# Patient Record
Sex: Female | Born: 1990 | Hispanic: No | Marital: Married | Smoking: Never smoker
Health system: Southern US, Community
[De-identification: ages and names within clinical notes are randomized; demographics above are authoritative.]

## PROBLEM LIST (undated history)

## (undated) DIAGNOSIS — Z789 Other specified health status: Secondary | ICD-10-CM

## (undated) HISTORY — PX: NO PAST SURGERIES: SHX2092

---

## 2015-04-18 LAB — OB RESULTS CONSOLE HIV ANTIBODY (ROUTINE TESTING): HIV: NONREACTIVE

## 2015-04-18 LAB — OB RESULTS CONSOLE RUBELLA ANTIBODY, IGM: Rubella: IMMUNE

## 2015-04-18 LAB — OB RESULTS CONSOLE ANTIBODY SCREEN: ANTIBODY SCREEN: NEGATIVE

## 2015-04-18 LAB — OB RESULTS CONSOLE GC/CHLAMYDIA
CHLAMYDIA, DNA PROBE: NEGATIVE
Gonorrhea: NEGATIVE

## 2015-04-18 LAB — OB RESULTS CONSOLE ABO/RH: RH TYPE: POSITIVE

## 2015-04-18 LAB — OB RESULTS CONSOLE RPR: RPR: NONREACTIVE

## 2015-04-18 LAB — OB RESULTS CONSOLE HEPATITIS B SURFACE ANTIGEN: Hepatitis B Surface Ag: NEGATIVE

## 2015-05-19 NOTE — L&D Delivery Note (Signed)
Operative Delivery Note At 12:03 PM a viable female was delivered via Vaginal, [employer], operational(Extractor).  Presentation: vertex; Position: Left,, Occiput,, Anterior; Station: +3.  Verbal consent: unable to obtain verbal consent due to language barrier.  Risks and benefits discussed in detail.  Risks include, but are not limited to the risks of anesthesia, bleeding, infection, damage to maternal tissues, fetal cephalhematoma.  There is also the risk of inability to effect vaginal delivery of the head, or shoulder dystocia that cannot be resolved by established maneuvers, leading to the need for emergency cesarean section.  APGAR: 9, 9; weight  .   Placenta status: Intact, Spontaneous.   Cord: 3 vessels with the following complications: None.  Cord pH: n/a  Anesthesia: Epidural  Instruments: kiwi Episiotomy: None Lacerations: 2nd degree Suture Repair: 2.0 vicryl Est. Blood Loss (mL): 100  Mom to postpartum.  Baby to Couplet care / Skin to Skin.  Wyvonnia DuskyLAWSON, MARIE DARLENE 06/02/2015, 12:14 PM

## 2015-05-21 LAB — OB RESULTS CONSOLE GBS: STREP GROUP B AG: NEGATIVE

## 2015-05-31 ENCOUNTER — Inpatient Hospital Stay (HOSPITAL_COMMUNITY)
Admission: AD | Admit: 2015-05-31 | Discharge: 2015-05-31 | Disposition: A | Discharge status: Home / Self Care | Payer: Self-pay | Source: Ambulatory Visit | Attending: Family Medicine | Admitting: Family Medicine

## 2015-05-31 ENCOUNTER — Encounter (HOSPITAL_COMMUNITY): Payer: Self-pay | Admitting: *Deleted

## 2015-05-31 DIAGNOSIS — Z3493 Encounter for supervision of normal pregnancy, unspecified, third trimester: Secondary | ICD-10-CM | POA: Insufficient documentation

## 2015-05-31 MED ORDER — MORPHINE SULFATE (PF) 4 MG/ML IV SOLN
4.0000 mg | Freq: Once | INTRAVENOUS | Status: AC
  Administered 2015-05-31: 4 mg via INTRAMUSCULAR
  Filled 2015-05-31: qty 1

## 2015-05-31 MED ORDER — MORPHINE SULFATE (PF) 4 MG/ML IV SOLN
4.0000 mg | Freq: Once | INTRAVENOUS | Status: AC
  Administered 2015-05-31: 4 mg via SUBCUTANEOUS
  Filled 2015-05-31: qty 1

## 2015-05-31 NOTE — MAU Note (Signed)
PT SAYS  SHE WAS  AT HD  YESTERDAY   .   NO VE.     LAST  WEEK-  VE    CLOSED.       HURT BAD  SINCE  0200

## 2015-05-31 NOTE — Discharge Instructions (Signed)
Abdominal Pain During Pregnancy Belly (abdominal) pain is common during pregnancy. Most of the time, it is not a serious problem. Other times, it can be a sign that something is wrong with the pregnancy. Always tell your doctor if you have belly pain. HOME CARE Monitor your belly pain for any changes. The following actions may help you feel better:  Do not have sex (intercourse) or put anything in your vagina until you feel better.  Rest until your pain stops.  Drink clear fluids if you feel sick to your stomach (nauseous). Do not eat solid food until you feel better.  Only take medicine as told by your doctor.  Keep all doctor visits as told. GET HELP RIGHT AWAY IF:   You are bleeding, leaking fluid, or pieces of tissue come out of your vagina.  You have more pain or cramping.  You keep throwing up (vomiting).  You have pain when you pee (urinate) or have blood in your pee.  You have a fever.  You do not feel your baby moving as much.  You feel very weak or feel like passing out.  You have trouble breathing, with or without belly pain.  You have a very bad headache and belly pain.  You have fluid leaking from your vagina and belly pain.  You keep having watery poop (diarrhea).  Your belly pain does not go away after resting, or the pain gets worse. MAKE SURE YOU:   Understand these instructions.  Will watch your condition.  Will get help right away if you are not doing well or get worse.   This information is not intended to replace advice given to you by your health care provider. Make sure you discuss any questions you have with your health care provider.   Document Released: 04/22/2009 Document Revised: 01/04/2013 Document Reviewed: 12/01/2012 Elsevier Interactive Patient Education 2016 [employer] Fetal Movement Counts Patient Name: __________________________________________________ Patient Due Date: ____________________ Performing a fetal movement count  is highly recommended in high-risk pregnancies, but it is good for every pregnant woman to do. Your health care provider may ask you to start counting fetal movements at 28 weeks of the pregnancy. Fetal movements often increase:  After eating a full meal.  After physical activity.  After eating or drinking something sweet or cold.  At rest. Pay attention to when you feel the baby is most active. This will help you notice a pattern of your baby's sleep and wake cycles and what factors contribute to an increase in fetal movement. It is important to perform a fetal movement count at the same time each day when your baby is normally most active.  HOW TO COUNT FETAL MOVEMENTS  Find a quiet and comfortable area to sit or lie down on your left side. Lying on your left side provides the best blood and oxygen circulation to your baby.  Write down the day and time on a sheet of paper or in a journal.  Start counting kicks, flutters, swishes, rolls, or jabs in a 2-hour period. You should feel at least 10 movements within 2 hours.  If you do not feel 10 movements in 2 hours, wait 2-3 hours and count again. Look for a change in the pattern or not enough counts in 2 hours. SEEK MEDICAL CARE IF:  You feel less than 10 counts in 2 hours, tried twice.  There is no movement in over an hour.  The pattern is changing or taking longer each day to reach  10 counts in 2 hours.  You feel the baby is not moving as he or she usually does. Date: ____________ Movements: ____________ Start time: ____________ Doreatha Martin time: ____________  Date: ____________ Movements: ____________ Start time: ____________ Doreatha Martin time: ____________ Date: ____________ Movements: ____________ Start time: ____________ Doreatha Martin time: ____________ Date: ____________ Movements: ____________ Start time: ____________ Doreatha Martin time: ____________ Date: ____________ Movements: ____________ Start time: ____________ Doreatha Martin time: ____________ Date:  ____________ Movements: ____________ Start time: ____________ Doreatha Martin time: ____________ Date: ____________ Movements: ____________ Start time: ____________ Doreatha Martin time: ____________ Date: ____________ Movements: ____________ Start time: ____________ Doreatha Martin time: ____________  Date: ____________ Movements: ____________ Start time: ____________ Doreatha Martin time: ____________ Date: ____________ Movements: ____________ Start time: ____________ Doreatha Martin time: ____________ Date: ____________ Movements: ____________ Start time: ____________ Doreatha Martin time: ____________ Date: ____________ Movements: ____________ Start time: ____________ Doreatha Martin time: ____________ Date: ____________ Movements: ____________ Start time: ____________ Doreatha Martin time: ____________ Date: ____________ Movements: ____________ Start time: ____________ Doreatha Martin time: ____________ Date: ____________ Movements: ____________ Start time: ____________ Doreatha Martin time: ____________  Date: ____________ Movements: ____________ Start time: ____________ Doreatha Martin time: ____________ Date: ____________ Movements: ____________ Start time: ____________ Doreatha Martin time: ____________ Date: ____________ Movements: ____________ Start time: ____________ Doreatha Martin time: ____________ Date: ____________ Movements: ____________ Start time: ____________ Doreatha Martin time: ____________ Date: ____________ Movements: ____________ Start time: ____________ Doreatha Martin time: ____________ Date: ____________ Movements: ____________ Start time: ____________ Doreatha Martin time: ____________ Date: ____________ Movements: ____________ Start time: ____________ Doreatha Martin time: ____________  Date: ____________ Movements: ____________ Start time: ____________ Doreatha Martin time: ____________ Date: ____________ Movements: ____________ Start time: ____________ Doreatha Martin time: ____________ Date: ____________ Movements: ____________ Start time: ____________ Doreatha Martin time: ____________ Date: ____________ Movements: ____________ Start  time: ____________ Doreatha Martin time: ____________ Date: ____________ Movements: ____________ Start time: ____________ Doreatha Martin time: ____________ Date: ____________ Movements: ____________ Start time: ____________ Doreatha Martin time: ____________ Date: ____________ Movements: ____________ Start time: ____________ Doreatha Martin time: ____________  Date: ____________ Movements: ____________ Start time: ____________ Doreatha Martin time: ____________ Date: ____________ Movements: ____________ Start time: ____________ Doreatha Martin time: ____________ Date: ____________ Movements: ____________ Start time: ____________ Doreatha Martin time: ____________ Date: ____________ Movements: ____________ Start time: ____________ Doreatha Martin time: ____________ Date: ____________ Movements: ____________ Start time: ____________ Doreatha Martin time: ____________ Date: ____________ Movements: ____________ Start time: ____________ Doreatha Martin time: ____________ Date: ____________ Movements: ____________ Start time: ____________ Doreatha Martin time: ____________  Date: ____________ Movements: ____________ Start time: ____________ Doreatha Martin time: ____________ Date: ____________ Movements: ____________ Start time: ____________ Doreatha Martin time: ____________ Date: ____________ Movements: ____________ Start time: ____________ Doreatha Martin time: ____________ Date: ____________ Movements: ____________ Start time: ____________ Doreatha Martin time: ____________ Date: ____________ Movements: ____________ Start time: ____________ Doreatha Martin time: ____________ Date: ____________ Movements: ____________ Start time: ____________ Doreatha Martin time: ____________ Date: ____________ Movements: ____________ Start time: ____________ Doreatha Martin time: ____________  Date: ____________ Movements: ____________ Start time: ____________ Doreatha Martin time: ____________ Date: ____________ Movements: ____________ Start time: ____________ Doreatha Martin time: ____________ Date: ____________ Movements: ____________ Start time: ____________ Doreatha Martin time:  ____________ Date: ____________ Movements: ____________ Start time: ____________ Doreatha Martin time: ____________ Date: ____________ Movements: ____________ Start time: ____________ Doreatha Martin time: ____________ Date: ____________ Movements: ____________ Start time: ____________ Doreatha Martin time: ____________ Date: ____________ Movements: ____________ Start time: ____________ Doreatha Martin time: ____________  Date: ____________ Movements: ____________ Start time: ____________ Doreatha Martin time: ____________ Date: ____________ Movements: ____________ Start time: ____________ Doreatha Martin time: ____________ Date: ____________ Movements: ____________ Start time: ____________ Doreatha Martin time: ____________ Date: ____________ Movements: ____________ Start time: ____________ Doreatha Martin time: ____________ Date: ____________ Movements: ____________ Start time: ____________ Doreatha Martin time: ____________ Date: ____________ Movements: ____________ Start time: ____________ Doreatha Martin time: ____________   This information is not intended to replace advice  given to you by your health care provider. Make sure you discuss any questions you have with your health care provider.   Document Released: 06/03/2006 Document Revised: 05/25/2014 Document Reviewed: 02/29/2012 Elsevier Interactive Patient Education [employer] Third Trimester of Pregnancy The third trimester is from week 29 through week 42, months 7 through 9. The third trimester is a time when the fetus is growing rapidly. At the end of the ninth month, the fetus is about 20 inches in length and weighs 6-10 pounds.  BODY CHANGES Your body goes through many changes during pregnancy. The changes vary from woman to woman.   Your weight will continue to increase. You can expect to gain 25-35 pounds (11-16 kg) by the end of the pregnancy.  You may begin to get stretch marks on your hips, abdomen, and breasts.  You may urinate more often because the fetus is moving lower into your pelvis and pressing  on your bladder.  You may develop or continue to have heartburn as a result of your pregnancy.  You may develop constipation because certain hormones are causing the muscles that push waste through your intestines to slow down.  You may develop hemorrhoids or swollen, bulging veins (varicose veins).  You may have pelvic pain because of the weight gain and pregnancy hormones relaxing your joints between the bones in your pelvis. Backaches may result from overexertion of the muscles supporting your posture.  You may have changes in your hair. These can include thickening of your hair, rapid growth, and changes in texture. Some women also have hair loss during or after pregnancy, or hair that feels dry or thin. Your hair will most likely return to normal after your baby is born.  Your breasts will continue to grow and be tender. A yellow discharge may leak from your breasts called colostrum.  Your belly button may stick out.  You may feel short of breath because of your expanding uterus.  You may notice the fetus "dropping," or moving lower in your abdomen.  You may have a bloody mucus discharge. This usually occurs a few days to a week before labor begins.  Your cervix becomes thin and soft (effaced) near your due date. WHAT TO EXPECT AT YOUR PRENATAL EXAMS  You will have prenatal exams every 2 weeks until week 36. Then, you will have weekly prenatal exams. During a routine prenatal visit:  You will be weighed to make sure you and the fetus are growing normally.  Your blood pressure is taken.  Your abdomen will be measured to track your baby's growth.  The fetal heartbeat will be listened to.  Any test results from the previous visit will be discussed.  You may have a cervical check near your due date to see if you have effaced. At around 36 weeks, your caregiver will check your cervix. At the same time, your caregiver will also perform a test on the secretions of the vaginal  tissue. This test is to determine if a type of bacteria, Group B streptococcus, is present. Your caregiver will explain this further. Your caregiver may ask you:  What your birth plan is.  How you are feeling.  If you are feeling the baby move.  If you have had any abnormal symptoms, such as leaking fluid, bleeding, severe headaches, or abdominal cramping.  If you are using any tobacco products, including cigarettes, chewing tobacco, and electronic cigarettes.  If you have any questions. Other tests or screenings that may be performed  during your third trimester include:  Blood tests that check for low iron levels (anemia).  Fetal testing to check the health, activity level, and growth of the fetus. Testing is done if you have certain medical conditions or if there are problems during the pregnancy.  HIV (human immunodeficiency virus) testing. If you are at high risk, you may be screened for HIV during your third trimester of pregnancy. FALSE LABOR You may feel small, irregular contractions that eventually go away. These are called Braxton Hicks contractions, or false labor. Contractions may last for hours, days, or even weeks before true labor sets in. If contractions come at regular intervals, intensify, or become painful, it is best to be seen by your caregiver.  SIGNS OF LABOR   Menstrual-like cramps.  Contractions that are 5 minutes apart or less.  Contractions that start on the top of the uterus and spread down to the lower abdomen and back.  A sense of increased pelvic pressure or back pain.  A watery or bloody mucus discharge that comes from the vagina. If you have any of these signs before the 37th week of pregnancy, call your caregiver right away. You need to go to the hospital to get checked immediately. HOME CARE INSTRUCTIONS   Avoid all smoking, herbs, alcohol, and unprescribed drugs. These chemicals affect the formation and growth of the baby.  Do not use any  tobacco products, including cigarettes, chewing tobacco, and electronic cigarettes. If you need help quitting, ask your health care provider. You may receive counseling support and other resources to help you quit.  Follow your caregiver's instructions regarding medicine use. There are medicines that are either safe or unsafe to take during pregnancy.  Exercise only as directed by your caregiver. Experiencing uterine cramps is a good sign to stop exercising.  Continue to eat regular, healthy meals.  Wear a good support bra for breast tenderness.  Do not use hot tubs, steam rooms, or saunas.  Wear your seat belt at all times when driving.  Avoid raw meat, uncooked cheese, cat litter boxes, and soil used by cats. These carry germs that can cause birth defects in the baby.  Take your prenatal vitamins.  Take 1500-2000 mg of calcium daily starting at the 20th week of pregnancy until you deliver your baby.  Try taking a stool softener (if your caregiver approves) if you develop constipation. Eat more high-fiber foods, such as fresh vegetables or fruit and whole grains. Drink plenty of fluids to keep your urine clear or pale yellow.  Take warm sitz baths to soothe any pain or discomfort caused by hemorrhoids. Use hemorrhoid cream if your caregiver approves.  If you develop varicose veins, wear support hose. Elevate your feet for 15 minutes, 3-4 times a day. Limit salt in your diet.  Avoid heavy lifting, wear low heal shoes, and practice good posture.  Rest a lot with your legs elevated if you have leg cramps or low back pain.  Visit your dentist if you have not gone during your pregnancy. Use a soft toothbrush to brush your teeth and be gentle when you floss.  A sexual relationship may be continued unless your caregiver directs you otherwise.  Do not travel far distances unless it is absolutely necessary and only with the approval of your caregiver.  Take prenatal classes to understand,  practice, and ask questions about the labor and delivery.  Make a trial run to the hospital.  Pack your hospital bag.  Prepare the baby's nursery.  Continue to go to all your prenatal visits as directed by your caregiver. SEEK MEDICAL CARE IF:  You are unsure if you are in labor or if your water has broken.  You have dizziness.  You have mild pelvic cramps, pelvic pressure, or nagging pain in your abdominal area.  You have persistent nausea, vomiting, or diarrhea.  You have a bad smelling vaginal discharge.  You have pain with urination. SEEK IMMEDIATE MEDICAL CARE IF:   You have a fever.  You are leaking fluid from your vagina.  You have spotting or bleeding from your vagina.  You have severe abdominal cramping or pain.  You have rapid weight loss or gain.  You have shortness of breath with chest pain.  You notice sudden or extreme swelling of your face, hands, ankles, feet, or legs.  You have not felt your baby move in over an hour.  You have severe headaches that do not go away with medicine.  You have vision changes.   This information is not intended to replace advice given to you by your health care provider. Make sure you discuss any questions you have with your health care provider.   Document Released: 04/28/2001 Document Revised: 05/25/2014 Document Reviewed: 07/05/2012 Elsevier Interactive Patient Education 2016 Else[employer] Inc[employer] of the uterus can occur throughout pregnancy. Contractions are not always a sign that you are in labor.  WHAT ARE BRAXTON HICKS CONTRACTIONS?  Contractions that occur before labor are called Braxton Hicks contractions, or false labor. Toward the end of pregnancy (32-34 weeks), these contractions can develop more often and may become more forceful. This is not true labor because these contractions do not result in opening (dilatation) and thinning of the cervix. They are sometimes difficult to  tell apart from true labor because these contractions can be forceful and people have different pain tolerances. You should not feel embarrassed if you go to the hospital with false labor. Sometimes, the only way to tell if you are in true labor is for your health care provider to look for changes in the cervix. If there are no prenatal problems or other health problems associated with the pregnancy, it is completely safe to be sent home with false labor and await the onset of true labor. HOW CAN YOU TELL THE DIFFERENCE BETWEEN TRUE AND FALSE LABOR? False Labor  The contractions of false labor are usually shorter and not as hard as those of true labor.   The contractions are usually irregular.   The contractions are often felt in the front of the lower abdomen and in the groin.   The contractions may go away when you walk around or change positions while lying down.   The contractions get weaker and are shorter lasting as time goes on.   The contractions do not usually become progressively stronger, regular, and closer together as with true labor.  True Labor  Contractions in true labor last 30-70 seconds, become very regular, usually become more intense, and increase in frequency.   The contractions do not go away with walking.   The discomfort is usually felt in the top of the uterus and spreads to the lower abdomen and low back.   True labor can be determined by your health care provider with an exam. This will show that the cervix is dilating and getting thinner.  WHAT TO REMEMBER  Keep up with your usual exercises and follow other instructions given by your health care provider.  Take medicines as directed by your health care provider.   Keep your regular prenatal appointments.   Eat and drink lightly if you think you are going into labor.   If Braxton Hicks contractions are making you uncomfortable:   Change your position from lying down or resting to  walking, or from walking to resting.   Sit and rest in a tub of warm water.   Drink 2-3 glasses of water. Dehydration may cause these contractions.   Do slow and deep breathing several times an hour.  WHEN SHOULD I SEEK IMMEDIATE MEDICAL CARE? Seek immediate medical care if:  Your contractions become stronger, more regular, and closer together.   You have fluid leaking or gushing from your vagina.   You have a fever.   You pass blood-tinged mucus.   You have vaginal bleeding.   You have continuous abdominal pain.   You have low back pain that you never had before.   You feel your baby's head pushing down and causing pelvic pressure.   Your baby is not moving as much as it used to.    This information is not intended to replace advice given to you by your health care provider. Make sure you discuss any questions you have with your health care provider.   Document Released: 05/04/2005 Document Revised: 05/09/2013 Document Reviewed: 02/13/2013 Elsevier Interactive Patient Education [employer]

## 2015-06-02 ENCOUNTER — Inpatient Hospital Stay (HOSPITAL_COMMUNITY): Payer: Medicaid Other | Admitting: Anesthesiology

## 2015-06-02 ENCOUNTER — Encounter (HOSPITAL_COMMUNITY): Payer: Self-pay | Admitting: *Deleted

## 2015-06-02 ENCOUNTER — Inpatient Hospital Stay (HOSPITAL_COMMUNITY)
Admission: AD | Admit: 2015-06-02 | Discharge: 2015-06-04 | DRG: 775 | Disposition: A | Discharge status: Home / Self Care | Payer: Medicaid Other | Source: Ambulatory Visit | Attending: Obstetrics & Gynecology | Admitting: Obstetrics & Gynecology

## 2015-06-02 DIAGNOSIS — Z3A38 38 weeks gestation of pregnancy: Secondary | ICD-10-CM

## 2015-06-02 DIAGNOSIS — IMO0001 Reserved for inherently not codable concepts without codable children: Secondary | ICD-10-CM

## 2015-06-02 HISTORY — DX: Other specified health status: Z78.9

## 2015-06-02 LAB — TYPE AND SCREEN
ABO/RH(D): A POS
ANTIBODY SCREEN: NEGATIVE

## 2015-06-02 LAB — POCT FERN TEST: POCT FERN TEST: POSITIVE

## 2015-06-02 LAB — RPR: RPR: NONREACTIVE

## 2015-06-02 LAB — ABO/RH: ABO/RH(D): A POS

## 2015-06-02 LAB — CBC
HCT: 30.4 % — ABNORMAL LOW (ref 36.0–46.0)
Hemoglobin: 9.2 g/dL — ABNORMAL LOW (ref 12.0–15.0)
MCH: 23.4 pg — ABNORMAL LOW (ref 26.0–34.0)
MCHC: 30.3 g/dL (ref 30.0–36.0)
MCV: 77.2 fL — AB (ref 78.0–100.0)
PLATELETS: 203 10*3/uL (ref 150–400)
RBC: 3.94 MIL/uL (ref 3.87–5.11)
RDW: 16 % — AB (ref 11.5–15.5)
WBC: 10.7 10*3/uL — AB (ref 4.0–10.5)

## 2015-06-02 MED ORDER — OXYCODONE-ACETAMINOPHEN 5-325 MG PO TABS: 2.0000 | ORAL_TABLET | ORAL | Status: DC | PRN

## 2015-06-02 MED ORDER — FENTANYL 2.5 MCG/ML BUPIVACAINE 1/10 % EPIDURAL INFUSION (WH - ANES)
14.0000 mL/h | INTRAMUSCULAR | Status: DC | PRN
  Administered 2015-06-02: 14 mL/h via EPIDURAL

## 2015-06-02 MED ORDER — CITRIC ACID-SODIUM CITRATE 334-500 MG/5ML PO SOLN: 30.0000 mL | ORAL | Status: DC | PRN

## 2015-06-02 MED ORDER — FLEET ENEMA 7-19 GM/118ML RE ENEM: 1.0000 | ENEMA | RECTAL | Status: DC | PRN

## 2015-06-02 MED ORDER — LIDOCAINE HCL (PF) 1 % IJ SOLN
INTRAMUSCULAR | Status: DC | PRN
  Administered 2015-06-02 (×2): 5 mL
  Administered 2015-06-02: 3 mL

## 2015-06-02 MED ORDER — FENTANYL 2.5 MCG/ML BUPIVACAINE 1/10 % EPIDURAL INFUSION (WH - ANES)
INTRAMUSCULAR | Status: AC
  Filled 2015-06-02: qty 125

## 2015-06-02 MED ORDER — DIBUCAINE 1 % RE OINT: 1.0000 "application " | TOPICAL_OINTMENT | RECTAL | Status: DC | PRN

## 2015-06-02 MED ORDER — SODIUM CHLORIDE 0.9 % IJ SOLN: 3.0000 mL | Freq: Two times a day (BID) | INTRAMUSCULAR | Status: DC

## 2015-06-02 MED ORDER — OXYCODONE-ACETAMINOPHEN 5-325 MG PO TABS
1.0000 | ORAL_TABLET | ORAL | Status: DC | PRN
Start: 2015-06-02 — End: 2015-06-02

## 2015-06-02 MED ORDER — DIPHENHYDRAMINE HCL 25 MG PO CAPS: 25.0000 mg | ORAL_CAPSULE | Freq: Four times a day (QID) | ORAL | Status: DC | PRN

## 2015-06-02 MED ORDER — SENNOSIDES-DOCUSATE SODIUM 8.6-50 MG PO TABS
2.0000 | ORAL_TABLET | ORAL | Status: DC
  Administered 2015-06-02 – 2015-06-03 (×2): 2 via ORAL
  Filled 2015-06-02 (×2): qty 2

## 2015-06-02 MED ORDER — ZOLPIDEM TARTRATE 5 MG PO TABS: 5.0000 mg | ORAL_TABLET | Freq: Every evening | ORAL | Status: DC | PRN

## 2015-06-02 MED ORDER — BENZOCAINE-MENTHOL 20-0.5 % EX AERO: 1.0000 "application " | INHALATION_SPRAY | CUTANEOUS | Status: DC | PRN

## 2015-06-02 MED ORDER — LIDOCAINE HCL (PF) 1 % IJ SOLN
30.0000 mL | INTRAMUSCULAR | Status: DC | PRN
  Filled 2015-06-02: qty 30

## 2015-06-02 MED ORDER — ONDANSETRON HCL 4 MG/2ML IJ SOLN: 4.0000 mg | INTRAMUSCULAR | Status: DC | PRN

## 2015-06-02 MED ORDER — PHENYLEPHRINE 40 MCG/ML (10ML) SYRINGE FOR IV PUSH (FOR BLOOD PRESSURE SUPPORT)
PREFILLED_SYRINGE | INTRAVENOUS | Status: AC
  Filled 2015-06-02: qty 20

## 2015-06-02 MED ORDER — SIMETHICONE 80 MG PO CHEW: 80.0000 mg | CHEWABLE_TABLET | ORAL | Status: DC | PRN

## 2015-06-02 MED ORDER — ACETAMINOPHEN 325 MG PO TABS: 650.0000 mg | ORAL_TABLET | ORAL | Status: DC | PRN

## 2015-06-02 MED ORDER — OXYTOCIN BOLUS FROM INFUSION
500.0000 mL | INTRAVENOUS | Status: DC
  Administered 2015-06-02: 500 mL via INTRAVENOUS

## 2015-06-02 MED ORDER — SODIUM CHLORIDE 0.9 % IJ SOLN: 3.0000 mL | INTRAMUSCULAR | Status: DC | PRN

## 2015-06-02 MED ORDER — MEASLES, MUMPS & RUBELLA VAC ~~LOC~~ INJ: 0.5000 mL | INJECTION | Freq: Once | SUBCUTANEOUS | Status: DC

## 2015-06-02 MED ORDER — LANOLIN HYDROUS EX OINT: TOPICAL_OINTMENT | CUTANEOUS | Status: DC | PRN

## 2015-06-02 MED ORDER — WITCH HAZEL-GLYCERIN EX PADS
1.0000 | MEDICATED_PAD | CUTANEOUS | Status: DC | PRN
Start: 2015-06-02 — End: 2015-06-04

## 2015-06-02 MED ORDER — SODIUM CHLORIDE 0.9 % IV SOLN: 250.0000 mL | INTRAVENOUS | Status: DC | PRN

## 2015-06-02 MED ORDER — LACTATED RINGERS IV SOLN
500.0000 mL | INTRAVENOUS | Status: DC | PRN
  Administered 2015-06-02: 500 mL via INTRAVENOUS

## 2015-06-02 MED ORDER — PRENATAL MULTIVITAMIN CH
1.0000 | ORAL_TABLET | Freq: Every day | ORAL | Status: DC
  Administered 2015-06-03 – 2015-06-04 (×2): 1 via ORAL
  Filled 2015-06-02 (×2): qty 1

## 2015-06-02 MED ORDER — DIPHENHYDRAMINE HCL 50 MG/ML IJ SOLN: 12.5000 mg | INTRAMUSCULAR | Status: DC | PRN

## 2015-06-02 MED ORDER — OXYTOCIN 10 UNIT/ML IJ SOLN
2.5000 [IU]/h | INTRAVENOUS | Status: DC
  Filled 2015-06-02: qty 4

## 2015-06-02 MED ORDER — ONDANSETRON HCL 4 MG PO TABS: 4.0000 mg | ORAL_TABLET | ORAL | Status: DC | PRN

## 2015-06-02 MED ORDER — IBUPROFEN 600 MG PO TABS
600.0000 mg | ORAL_TABLET | Freq: Four times a day (QID) | ORAL | Status: DC
  Administered 2015-06-02 – 2015-06-04 (×8): 600 mg via ORAL
  Filled 2015-06-02 (×8): qty 1

## 2015-06-02 MED ORDER — ONDANSETRON HCL 4 MG/2ML IJ SOLN: 4.0000 mg | Freq: Four times a day (QID) | INTRAMUSCULAR | Status: DC | PRN

## 2015-06-02 MED ORDER — TETANUS-DIPHTH-ACELL PERTUSSIS 5-2.5-18.5 LF-MCG/0.5 IM SUSP: 0.5000 mL | Freq: Once | INTRAMUSCULAR | Status: DC

## 2015-06-02 MED ORDER — EPHEDRINE 5 MG/ML INJ: 10.0000 mg | INTRAVENOUS | Status: DC | PRN

## 2015-06-02 MED ORDER — PHENYLEPHRINE 40 MCG/ML (10ML) SYRINGE FOR IV PUSH (FOR BLOOD PRESSURE SUPPORT): 80.0000 ug | PREFILLED_SYRINGE | INTRAVENOUS | Status: DC | PRN

## 2015-06-02 MED ORDER — LACTATED RINGERS IV SOLN
INTRAVENOUS | Status: DC
  Administered 2015-06-02: 06:00:00 via INTRAVENOUS

## 2015-06-02 NOTE — Progress Notes (Signed)
Sharon Hall is a 25 y.o. G1P0 at 7077w0d by ultrasound admitted for rupture of membranes  Subjective:   Objective: BP 118/78 mmHg  Pulse 94  Temp(Src) 97.9 F (36.6 C) (Oral)  Resp 19  Ht 5\' 3"  (1.6 m)  Wt 135 lb 9.6 oz (61.508 kg)  BMI 24.03 kg/m2  SpO2 99%      FHT:  FHR: 120 bpm, variability: moderate,  accelerations:  Present,  decelerations:  Present variable UC:   regular, every 2-3 minutes SVE:   Dilation: 6 Effacement (%): 90 Station: -1 Exam by:: Eastman ChemicalMiddleton, RN  Labs: Lab Results  Component Value Date   WBC 10.7* 06/02/2015   HGB 9.2* 06/02/2015   HCT 30.4* 06/02/2015   MCV 77.2* 06/02/2015   PLT 203 06/02/2015    Assessment / Plan: Augmentation of labor, progressing well  Labor: Progressing normally Preeclampsia:  no signs or symptoms of toxicity and intake and ouput balanced Fetal Wellbeing:  Category II Pain Control:  Epidural I/D:  n/a Anticipated MOD:  NSVD  Terry Abila DARLENE 06/02/2015, 9:48 AM

## 2015-06-02 NOTE — H&P (Signed)
Sharon Hall is a 25 y.o. female G1P0 with IUP at 5079w0d presenting for ROM and contractions. Pt states she has been having regular associated with scant staining vaginal bleeding.  Membranes are ruptured, with active fetal movement.   [facility]. Previously received prenatal care in [place].   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 Normal on 12/12/ US  Past Medical History: Past Medical History  Diagnosis Date  . Medical history non-contributory     Past Surgical History: Past Surgical History  Procedure Laterality Date  . No past surgeries      Obstetrical History: OB History    Gravida Para Term Preterm AB TAB SAB Ectopic Multiple Living   1               Gynecological History: OB History    Gravida Para Term Preterm AB TAB SAB Ectopic Multiple Living   1               Social History: Social History   Social History  . Marital Status: Married    Spouse Name: N/A  . Number of Children: N/A  . Years of Education: N/A   Social History Main Topics  . Smoking status: Never Smoker   . Smokeless tobacco: None  . Alcohol Use: No  . Drug Use: No  . Sexual Activity: Not Asked   Other Topics Concern  . None   Social History Narrative    Family History: History reviewed. No pertinent family history.  Allergies: Not on File  Prescriptions prior to admission  Medication Sig Dispense Refill Last Dose  . acetaminophen (TYLENOL) 500 MG tablet Take 1,000 mg by mouth every 6 (six) hours as needed.   06/01/2015 at Unknown time  . acetaminophen (TYLENOL) 325 MG tablet Take 650 mg by mouth every 6 (six) hours as needed.   05/31/2015 at Unknown time  . Prenatal Vit-Fe Fumarate-FA (MULTIVITAMIN-PRENATAL) 27-0.8 MG TABS tablet Take 1 tablet by mouth daily at 12 noon.   05/30/2015 at Unknown time     Review of Systems   Constitutional: No fevers or chills  Blood pressure 119/81,  pulse 98, temperature 97.9 F (36.6 C), resp. rate 18, height 5\' 3"  (1.6 m), weight 61.508 kg (135 lb 9.6 oz). General appearance: alert, cooperative, appears stated age and no distress Lungs: clear to auscultation bilaterally Heart: regular rate and rhythm Abdomen: soft, non-tender; bowel sounds normal Extremities: Homans sign is negative, no sign of DVT Presentation: cephalic Fetal monitoringBaseline: 120 bpm, Variability: Good {> 6 bpm), Accelerations: Reactive and Decelerations: Absent Uterine activity: Eery 2 minutes  Dilation: 3 Effacement (%): 90 Station: -1 Exam by:: Quintella BatonJo Barham RNC   Prenatal labs: ABO, Rh: A/Positive/-- (12/01 0000) Antibody: Negative (12/01 0000) Rubella: !Error! RPR: Nonreactive (12/01 0000)  HBsAg: Negative (12/01 0000)  HIV: Non-reactive (12/01 0000)  GBS: Negative (01/03 0000)  1 hr Glucola 116 Genetic screening  Declined Anatomy US: Reported placenta previa in [place]. Resolved on 12/12 US.    Prenatal Transfer Tool  Maternal Diabetes: No Genetic Screening: Declined Maternal Ultrasounds/Referrals: Reported placenta previa in [place]. Resolved on 12/12 US.  Fetal Ultrasounds or other Referrals:  None Maternal Substance Abuse:  No Significant Maternal Medications:  None Significant Maternal Lab Results: None     Results for orders placed or performed during the hospital encounter of 06/02/15 (from the past 24 hour(s))  Advance Auto Fern Test  Collection Time: 06/02/15  4:58 AM  Result Value Ref Range   POCT Fern Test Positive = ruptured amniotic membanes     Assessment: Sharon Hall is a 26 y.o. G1P0 at [redacted]w[redacted]d here with SOL and SOM #Labor: SOL. Expectant management #Pain: Analgesia upon request #FWB: Category 1 #ID:  GBC negative #MOF: Breast #MOC: Undecided #Circ:  Yes  Sharon Hall 06/02/2015, 5:42 AM   Seen and examined by me also Agree with note Patient prefers expectant management for now FHR reactive Aviva Signs, CNM

## 2015-06-02 NOTE — Anesthesia Postprocedure Evaluation (Signed)
Anesthesia Post Note  Patient: Sharon Hall  Procedure(s) Performed: * No procedures listed *  Patient location during evaluation: Mother Baby Anesthesia Type: Epidural Level of consciousness: awake Pain management: pain level controlled Vital Signs Assessment: post-procedure vital signs reviewed and stable Respiratory status: spontaneous breathing Cardiovascular status: stable Postop Assessment: no headache, no backache, epidural receding, patient able to bend at knees, no signs of nausea or vomiting and adequate PO intake Anesthetic complications: no    Last Vitals:  Filed Vitals:   06/02/15 1330 06/02/15 1428  BP: 112/63 110/69  Pulse: 84 90  Temp:  36.7 C  Resp:  18    Last Pain:  Filed Vitals:   06/02/15 1432  PainSc: 0-No pain                 Jaylianna Tatlock

## 2015-06-02 NOTE — Progress Notes (Signed)
LeighAnn RN in [employer] given report on pt. Will call back with room #

## 2015-06-02 NOTE — Anesthesia Procedure Notes (Signed)
Epidural Patient location during procedure: OB  Staffing Anesthesiologist: Loistine Eberlin Performed by: anesthesiologist   Preanesthetic Checklist Completed: patient identified, site marked, surgical consent, pre-op evaluation, timeout performed, IV checked, risks and benefits discussed and monitors and equipment checked  Epidural Patient position: sitting Prep: DuraPrep Patient monitoring: heart rate, continuous pulse ox and blood pressure Approach: right paramedian Location: L3-L4 Injection technique: LOR saline  Needle:  Needle type: Tuohy  Needle gauge: 17 G Needle length: 9 cm and 9 Needle insertion depth: 4 cm Catheter type: closed end flexible Catheter size: 20 Guage Catheter at skin depth: 8 cm Test dose: negative  Assessment Events: blood not aspirated, injection not painful, no injection resistance, negative IV test and no paresthesia  Additional Notes Patient identified. Risks/Benefits/Options discussed with patient including but not limited to bleeding, infection, nerve damage, paralysis, failed block, incomplete pain control, headache, blood pressure changes, nausea, vomiting, reactions to medication both or allergic, itching and postpartum back pain. Confirmed with bedside nurse the patient's most recent platelet count. Confirmed with patient that they are not currently taking any anticoagulation, have any bleeding history or any family history of bleeding disorders. Patient expressed understanding and wished to proceed. All questions were answered. Sterile technique was used throughout the entire procedure. Please see nursing notes for vital signs. Test dose was given through epidural needle and negative prior to continuing to dose epidural or start infusion. Warning signs of high block given to the patient including shortness of breath, tingling/numbness in hands, complete motor block, or any concerning symptoms with instructions to call for help. Patient was given  instructions on fall risk and not to get out of bed. All questions and concerns addressed with instructions to call with any issues.   

## 2015-06-02 NOTE — Progress Notes (Signed)
Dr Jimmey RalphParker notified of pt's admission and status. Aware of ctx pattern, mec fld, occ irreg FHR but reactive, sve. Will see pt

## 2015-06-02 NOTE — MAU Note (Signed)
Leaking fld and contractions since 1800 Sat. Fld is clear and leaking all night

## 2015-06-02 NOTE — Anesthesia Preprocedure Evaluation (Signed)

## 2015-06-02 NOTE — Progress Notes (Signed)
Pt up to BR

## 2015-06-02 NOTE — Progress Notes (Signed)
Pt. Up to bathroom; EFM removed; FHR category 1 before removal of EFM

## 2015-06-02 NOTE — Progress Notes (Signed)
occ irregularity in FHR with no particular pattern.

## 2015-06-02 NOTE — MAU Note (Signed)
Pt has personal interpreter with her she prefers to interpret- Sharon Hall. Waiver of Right To Free Interpretation Services signed

## 2015-06-03 MED ORDER — IBUPROFEN 600 MG PO TABS: 600.0000 mg | ORAL_TABLET | Freq: Four times a day (QID) | ORAL | Status: DC

## 2015-06-03 NOTE — Discharge Summary (Signed)
OB Discharge Summary  Patient Name: Sharon Hall DOB: [birth year] MRN: [id number]  Date of admission: 06/02/2015 Delivering MD: Wyvonnia Dusky D   Date of discharge: 06/03/2015  Admitting diagnosis: 38wks, water break, bleeding Intrauterine pregnancy: 102w0d     Secondary diagnosis:Active Problems:   Active labor  Additional problems:none     Discharge diagnosis: Term Pregnancy Delivered                                                                     Post partum procedures:n/a  Augmentation: Pitocin  Complications: None  Hospital course:  Onset of Labor With Vaginal Delivery     25 y.o. yo G1P1001 at [redacted]w[redacted]d was admitted in Latent Labor on 06/02/2015. Patient had an uncomplicated labor course as follows:  Membrane Rupture Time/Date: 6:00 PM ,06/01/2015   Intrapartum Procedures: Episiotomy: None [1]                                         Lacerations:  2nd degree [3]  Patient had a delivery of a Viable infant. 06/02/2015  Information for the patient's newborn:  Lynne, Takemoto [[id number]]  Delivery Method: Vaginal, Vacuum (Extractor) (Filed from Delivery Summary)    Pateint had an uncomplicated postpartum course.  She is ambulating, tolerating a regular diet, passing flatus, and urinating well. Patient is discharged home in stable condition on 06/03/2015.    Physical exam  Filed Vitals:   06/02/15 1428 06/02/15 1540 06/02/15 2137 06/03/15 0530  BP: 110/69 111/69 121/77 109/70  Pulse: 90 96 85 90  Temp: 98 F (36.7 C) 98 F (36.7 C) 97.8 F (36.6 C) 98.4 F (36.9 C)  TempSrc: Oral Oral Oral Oral  Resp: 18 17 18 18   Height:      Weight:      SpO2:   99% 99%   General: alert, cooperative and no distress Lochia: appropriate Uterine Fundus: firm Incision: N/A DVT Evaluation: No evidence of DVT seen on physical exam. No cords or calf tenderness. No significant calf/ankle edema. Labs: Lab Results  Component Value Date   WBC 10.7* 06/02/2015   HGB  9.2* 06/02/2015   HCT 30.4* 06/02/2015   MCV 77.2* 06/02/2015   PLT 203 06/02/2015   No flowsheet data found.  Discharge instruction: per After Visit Summary and "Baby and Me Booklet".  After Visit Meds:    Medication List    ASK your doctor about these medications        acetaminophen 325 MG tablet  Commonly known as:  TYLENOL  Take 650 mg by mouth every 6 (six) hours as needed.     acetaminophen 500 MG tablet  Commonly known as:  TYLENOL  Take 1,000 mg by mouth every 6 (six) hours as needed.     calcium carbonate 500 MG chewable tablet  Commonly known as:  TUMS - dosed in mg elemental calcium  Chew 1 tablet by mouth daily.     multivitamin-prenatal 27-0.8 MG Tabs tablet  Take 1 tablet by mouth daily at 12 noon.     ranitidine 75 MG tablet  Commonly known as:  ZANTAC  Take  75 mg by mouth 2 (two) times daily.        Diet: routine diet  Activity: Advance as tolerated. Pelvic rest for 6 weeks.   Outpatient follow up:6 weeks Follow up Appt:No future appointments. Follow up visit: No Follow-up on file.  Postpartum contraception: Undecided  Newborn Data: Live born female  Birth Weight: 6 lb 14.4 oz (3130 g) APGAR: 9, 9  Baby Feeding: Bottle and Breast Disposition:home with mother   06/03/2015 Ferdie PingLAWSON, MARIE DARLENE, CNM

## 2015-06-03 NOTE — Progress Notes (Signed)
CSW acknowledges consult for late prenatal care.  CSW attempted to meet with MOB on two separate occassions. On first attempt, MOB was sleeping. On second attempt, MOB and FOB were meeting with the financial counselor.   CSW to follow up.

## 2015-06-03 NOTE — Lactation Note (Signed)
This note was copied from the chart of Sharon Hall. Lactation Consultation Note New mom speaks [place], FOB interpreter for her. Mom denies pain during BF. Hand expression demonstrated w/everted nipples. No colostrum noted. Breast soft. Mom encouraged to feed baby 8-12 times/24 hours and with feeding cues. Referred to Baby and Me Book in Breastfeeding section Pg. 22-23 for position options and Proper latch demonstration. Educated about newborn behavior, STS, supply and demand. WH/LC brochure given w/resources, support groups and LC services. Mom had been BF in side lying position. Discussed other options,. Patient Name: Sharon Delsa Granaihal Devoto RUEAV'WToday's Date: 06/03/2015 Reason for consult: Initial assessment   Maternal Data Has patient been taught Hand Expression?: Yes Does the patient have breastfeeding experience prior to this delivery?: No  Feeding Feeding Type: Breast Fed Length of feed: 30 min  LATCH Score/Interventions    Intervention(s): Skin to skin;Hand expression;Alternate breast massage  Type of Nipple: Everted at rest and after stimulation  Comfort (Breast/Nipple): Soft / non-tender     Intervention(s): Skin to skin;Position options;Support Pillows;Breastfeeding basics reviewed     Lactation Tools Discussed/Used WIC Program: Yes   Consult Status Consult Status: Follow-up Date: 06/04/15 Follow-up type: In-patient    Charyl DancerCARVER, Jaymien Landin G 06/03/2015, 6:34 AM

## 2015-06-04 MED ORDER — ACETAMINOPHEN 325 MG PO TABS: 650.0000 mg | ORAL_TABLET | ORAL | Status: AC | PRN

## 2015-06-04 NOTE — Discharge Summary (Signed)
OB Discharge Summary     Patient Name: Sharon Hall DOB: [birth year] MRN: [id number]  Date of admission: 06/02/2015 Delivering MD: Wyvonnia Dusky D   Date of discharge: 06/04/2015  Admitting diagnosis: 38wks, water break, bleeding Intrauterine pregnancy: [redacted]w[redacted]d     Secondary diagnosis:  Active Problems:   Active labor  Additional problems: vacuum-assisted vaginal delivery     Discharge diagnosis: Term Pregnancy Delivered                                                                                                Post partum procedures:none  Augmentation: none  Complications: vacuum-assistance vaginal delivery  Hospital course:  Onset of Labor With Vaginal Delivery     25 y.o. yo G1P1001 at [redacted]w[redacted]d was admitted in Active Labor on 06/02/2015 s/p SROM at home. Patient had an uncomplicated labor course as follows:  Membrane Rupture Time/Date: 6:00 PM ,06/01/2015   Intrapartum Procedures: Episiotomy: None [1]                                         Lacerations:  2nd degree [3]  Patient had a delivery of a Viable infant. 06/02/2015  Information for the patient's newborn:  Madeliene, Tejera [[id number]]  Delivery Method: Vaginal, Vacuum (Extractor) (Filed from Delivery Summary)    Pateint had an uncomplicated postpartum course.  She is ambulating, tolerating a regular diet, passing flatus, and urinating well. Patient is discharged home in stable condition on 06/04/2015.    Physical exam  Filed Vitals:   06/02/15 2137 06/03/15 0530 06/03/15 1739 06/04/15 0515  BP: 121/77 109/70 114/76 111/68  Pulse: 85 90 84 81  Temp: 97.8 F (36.6 C) 98.4 F (36.9 C) 98.4 F (36.9 C) 98.3 F (36.8 C)  TempSrc: Oral Oral Oral Oral  Resp: Height:      Weight:      SpO2: 99% 99%     General: alert, cooperative and no distress Lochia: appropriate Uterine Fundus: firm Incision: N/A DVT Evaluation: No cords or calf tenderness. No significant calf/ankle edema. Labs: Lab  Results  Component Value Date   WBC 10.7* 06/02/2015   HGB 9.2* 06/02/2015   HCT 30.4* 06/02/2015   MCV 77.2* 06/02/2015   PLT 203 06/02/2015   No flowsheet data found.  Discharge instruction: per After Visit Summary and "Baby and Me Booklet".  After visit meds:    Medication List    TAKE these medications        acetaminophen 325 MG tablet  Commonly known as:  TYLENOL  Take 2 tablets (650 mg total) by mouth every 4 (four) hours as needed (for pain scale < 4ORtemperature>/=100.5 F).     calcium carbonate 500 MG chewable tablet  Commonly known as:  TUMS - dosed in mg elemental calcium  Chew 1 tablet by mouth daily.     ibuprofen 600 MG tablet  Commonly known as:  ADVIL,MOTRIN  Take 1 tablet (600 mg total) by mouth every 6 (six)  hours.     multivitamin-prenatal 27-0.8 MG Tabs tablet  Take 1 tablet by mouth daily at 12 noon.     ranitidine 75 MG tablet  Commonly known as:  ZANTAC  Take 75 mg by mouth 2 (two) times daily.        Diet: routine diet  Activity: Advance as tolerated. Pelvic rest for 6 weeks.   Outpatient follow up:6 weeks Follow up Appt:No future appointments. Follow up Visit:No Follow-up on file.  Postpartum contraception: None (counseled)  Newborn Data: Live born female  Birth Weight: 6 lb 14.4 oz (3130 g) APGAR: 9, 9  Baby Feeding: Breast Disposition:home with mother   06/04/2015 Silvano Bilis, MD

## 2015-06-04 NOTE — Discharge Instructions (Signed)

## 2015-06-04 NOTE — Clinical Social Work Maternal (Signed)
CLINICAL SOCIAL WORK MATERNAL/CHILD NOTE  Patient Details  Name: Sharon Hall MRN: [id number] Date of Birth: 12/26/1990  Date:  06/04/2015  Clinical Social Worker Initiating Note:  Suzanne Garbers MSW, LCSW Date/ Time Initiated:  06/04/15/0945     Child's Name:  Sharon Hall   Legal Guardian:  Jazmynn Christenson and Khaled Malik  Need for Interpreter:  Arabic   Date of Referral:  06/02/15     Reason for Referral:  Late or No Prenatal Care    Referral Source:  Central Nursery   Address:  [address] [place], [place] [address]  Phone number:  [phone]   Household Members:  Spouse   Natural Supports (not living in the home):  Immediate Family   Professional Supports: None   Employment: Unemployed   Type of Work:     Education:      Financial Resources:  Self-Pay    Other Resources:  WIC   Cultural/Religious Considerations Which May Impact Care:  Recently moved to [place] from [place]  Strengths:  Ability to meet basic needs , Home prepared for child , Pediatrician chosen    Risk Factors/Current Problems:   1. Late prenatal care in [place] (at 31 weeks) due to recent move to [place] from [place]. Family continues to adjust to move and new community.   Cognitive State:  Able to Concentrate , Alert , Goal Oriented    Mood/Affect:  Euthymic , Calm , Comfortable    CSW Assessment:  CSW received request for consult due to MOB presenting late to prenatal care at 31 weeks.  Per chart review, she received prenatal care in [place], but prenatal care was unable to be confirmed. CSW utilized Arabic interpreter ([id number]) via [employer].   MOB was quiet and reserved, but she displayed a full range in affect and was noted to be interacting and caring for the infant. FOB provided majority of information during the assessment. Per FOB, they moved to [place] approximately 3 months ago from [place]. He stated that they are adjusting well  and without concerns. He shared that he has a cousin and a sibling that live in [place], and reported that they have been helpful as they learn to navigate their new home community.  FOB reported that he has a young nephew, and they have been able to learn about how to navigate caring for children because of his nephew.  FOB confirmed that the home is prepared for the infant. He stated that he is unemployed, but they are able to meet all basic needs.    MOB and FOB endorsed presence of mixed emotions as they celebrate a birth but miss their family at home. FOB stated that they continue to remain in contact with their family via technology, and are grateful that they are able to interact in this manner.  MOB and FOB denied need for additional help and support as they transition home, and expressed gratitude for the care that they have received at the hospital.   CSW inquired about prenatal care.  MOB reported receiving prenatal care in [place] prior to their move.  CSW provide education on hospital drug screen policy due to inability to confirm early prenatal care.  MOB and FOB denied questions or concerns, and denied all substance use during the pregnancy.   CSW Plan/Description:   1. Patient/Family Education- hospital drug screen policy 2.  CSW to monitor toxicology screens, and will notify CPS of results. 3.No Further Intervention Required/No Barriers   to Discharge    Cheyanne Lamison N, LCSW 06/04/2015, 10:41 AM  

## 2016-05-18 NOTE — L&D Delivery Note (Signed)
Patient is a 26 y.o. now G2P2002 s/p NSVD at 297w3d, who was admitted for SOL.  Delivery Note: Philipp DeputyKim Shaunta Hall, CNM was present for actual delivery At 6:06 AM a viable female was delivered via Vaginal, Spontaneous (Presentation: direct OA ;  ).  APGAR: 5 , 8; weight pending .   Placenta status: intact 3VC:  with no complications: .  Cord pH: n/a  Anesthesia:  epidural Episiotomy: None Lacerations: none requiring repair Suture Repair: n/a Est. Blood Loss (mL):  200  Head delivered direct OA. Single nuchal cord present, baby delivered through cord and then reduced. Shoulder and body delivered in usual fashion. Infant with spontaneous cry, placed on mother's abdomen, dried and bulb suctioned. Cord clamped x 2 after 1-minute delay, and cut by family member. Cord blood drawn. Placenta delivered spontaneously with gentle cord traction. Fundus firm with massage and Pitocin. Perineum inspected and found to have no laceration requiring repair  Mom to postpartum.  Baby to Couplet care / Skin to Skin.  Sharon RollingScott Hall 04/11/2017, 6:25 AM  Patient is a G2P1001 at 7097w3d who was admitted in advanced labor, essentially uncomplicated prenatal course.  She progressed with augmentation via AROM.  I was gloved and present for delivery in its entirety.  Second stage of labor progressed, baby delivered after approx 30 mins of pushing.  Mild-mod decels during second stage noted.  Complications: none  Lacerations: sm skin separation at ant aspect of infibulation- not bldg, not repaired  EBL: 200cc  Cam HaiSHAW, Sharon Hall, CNM 7:47 AM 04/11/2017

## 2016-08-31 ENCOUNTER — Emergency Department (HOSPITAL_COMMUNITY)
Admission: EM | Admit: 2016-08-31 | Discharge: 2016-08-31 | Disposition: A | Discharge status: Home / Self Care | Payer: Medicaid Other | Attending: Emergency Medicine | Admitting: Emergency Medicine

## 2016-08-31 ENCOUNTER — Encounter (HOSPITAL_COMMUNITY): Payer: Self-pay

## 2016-08-31 DIAGNOSIS — Z3A12 12 weeks gestation of pregnancy: Secondary | ICD-10-CM | POA: Insufficient documentation

## 2016-08-31 DIAGNOSIS — M79605 Pain in left leg: Secondary | ICD-10-CM | POA: Diagnosis not present

## 2016-08-31 DIAGNOSIS — Z79899 Other long term (current) drug therapy: Secondary | ICD-10-CM | POA: Diagnosis not present

## 2016-08-31 DIAGNOSIS — O9989 Other specified diseases and conditions complicating pregnancy, childbirth and the puerperium: Secondary | ICD-10-CM | POA: Insufficient documentation

## 2016-08-31 DIAGNOSIS — Z3490 Encounter for supervision of normal pregnancy, unspecified, unspecified trimester: Secondary | ICD-10-CM

## 2016-08-31 LAB — URINALYSIS, ROUTINE W REFLEX MICROSCOPIC
Bilirubin Urine: NEGATIVE
GLUCOSE, UA: NEGATIVE mg/dL
Hgb urine dipstick: NEGATIVE
Ketones, ur: NEGATIVE mg/dL
LEUKOCYTES UA: NEGATIVE
Nitrite: NEGATIVE
PH: 7 (ref 5.0–8.0)
Protein, ur: NEGATIVE mg/dL
Specific Gravity, Urine: 1.018 (ref 1.005–1.030)

## 2016-08-31 LAB — POC URINE PREG, ED: Preg Test, Ur: POSITIVE — AB

## 2016-08-31 NOTE — ED Provider Notes (Signed)
[facility]-EMERGENCY DEPT Provider Note   CSN: [id number] Arrival date & time: 08/31/16  1303   By signing my name below, I, [name], attest that this documentation has been prepared under the direction and in the presence of Lyndel Safe, PA-C Electronically Signed: [name], ED Scribe. 08/31/16. 3:47 PM.  History   Chief Complaint Chief Complaint  Patient presents with  . Back Pain  . Leg Pain    HPI Sharon Hall is a 26 y.o. female who presents to the Emergency Department complaining of intermittent, right lower back pain onset 10 days ago. Pt reports associated right leg pain x 3 months. She notes that her right leg pain is worsened with prolonged ambulation. Pt has not tried any medications for the relief of her symptoms. Patient's last menstrual period was 06/30/2016. Pt denies nausea, HA, abdominal pain, dysuria, urinary frequency, recent trauma, recent injury, and any other symptoms. Denies taking daily medications.    The history is provided by the patient. A language interpreter was used (Arabic).    Past Medical History:  Diagnosis Date  . Medical history non-contributory     Patient Active Problem List   Diagnosis Date Noted  . Active labor 06/02/2015    Past Surgical History:  Procedure Laterality Date  . NO PAST SURGERIES      OB History    Gravida Para Term Preterm AB Living   SAB TAB Ectopic Multiple Live Births         0 1       Home Medications    Prior to Admission medications   Medication Sig Start Date End Date Taking? Authorizing Provider  acetaminophen (TYLENOL) 325 MG tablet Take 2 tablets (650 mg total) by mouth every 4 (four) hours as needed (for pain scale < 4ORtemperature>/=100.5 F). 06/04/15   Kathrynn Running, MD  calcium carbonate (TUMS - DOSED IN MG ELEMENTAL CALCIUM) 500 MG chewable tablet Chew 1 tablet by mouth daily.    Historical Provider, MD  ibuprofen (ADVIL,MOTRIN) 600 MG tablet Take 1 tablet  (600 mg total) by mouth every 6 (six) hours. 06/03/15   Montez Morita, CNM  Prenatal Vit-Fe Fumarate-FA (MULTIVITAMIN-PRENATAL) 27-0.8 MG TABS tablet Take 1 tablet by mouth daily at 12 noon.    Historical Provider, MD  ranitidine (ZANTAC) 75 MG tablet Take 75 mg by mouth 2 (two) times daily.    Historical Provider, MD    Family History History reviewed. No pertinent family history.  Social History Social History  Substance Use Topics  . Smoking status: Never Smoker  . Smokeless tobacco: Never Used  . Alcohol use No     Allergies   Patient has no known allergies.   Review of Systems Review of Systems  Gastrointestinal: Negative for abdominal pain and nausea.  Genitourinary: Negative for dysuria, frequency, vaginal bleeding and vaginal discharge.  Musculoskeletal: Positive for arthralgias (right leg) and back pain (right lower).  Neurological: Negative for light-headedness, numbness and headaches.     Physical Exam Updated Vital Signs BP 119/67   Pulse 92   Temp 97.8 F (36.6 C) (Oral)   Resp 15   Ht  (1.575 m)   Wt 51.6 kg   LMP 06/30/2016   SpO2 100%   BMI 20.81 kg/m   Physical Exam  Constitutional: She appears well-developed and well-nourished. No distress.  HENT:  Head: Normocephalic and atraumatic.  Eyes: EOM are normal.  Neck: Neck supple.  Cardiovascular: Normal rate.   Pulmonary/Chest: Effort normal. No respiratory distress.  Abdominal: She exhibits no distension.  Musculoskeletal: Normal range of motion. She exhibits no tenderness or deformity.  Right paraspinal tenderness, mild.  Patient is able to walk with out difficulty.  Neurological: No sensory deficit. She exhibits normal muscle tone. Coordination normal.  Skin: Skin is warm and dry.  Psychiatric: She has a normal mood and affect. Her behavior is normal.  Nursing note and vitals reviewed.    ED Treatments / Results  DIAGNOSTIC STUDIES: Oxygen Saturation is 100% on RA, nl by my  interpretation.    COORDINATION OF CARE: 3:18 PM Discussed treatment plan with pt at bedside which includes UA, referral and follow up to women's clinic, and pt agreed to plan.   Labs (all labs ordered are listed, but only abnormal results are displayed) Labs Reviewed  URINALYSIS, ROUTINE W REFLEX MICROSCOPIC - Abnormal; Notable for the following:       Result Value   APPearance HAZY (*)    All other components within normal limits  POC URINE PREG, ED - Abnormal; Notable for the following:    Preg Test, Ur POSITIVE (*)    All other components within normal limits    Procedures Procedures (including critical care time)  Medications Ordered in ED Medications - No data to display   Initial Impression / Assessment and Plan / ED Course  I have reviewed the triage vital signs and the nursing notes.  Pertinent labs that were available during my care of the patient were reviewed by me and considered in my medical decision making (see chart for details).    Leg pain is consistent with shin splints.  Patient was advised to wear supportive shoes and modify activity as needed.    Patient has a positive urine pregnancy test today.  She was advised to not drink alcohol, eat a healthy diet, start taking prenatal vitamins, and follow with women's clinic for OB care.    Patient with back pain.  No neurological deficits and normal neuro exam.  Patient is ambulatory.  No loss of bowel or bladder control.  No concern for cauda equina.  No fever, night sweats, weight loss, h/o cancer, IVDA, no recent procedure to back. No urinary symptoms suggestive of UTI.  Supportive care and return precaution discussed. Appears safe for discharge at this time. Follow up as indicated in discharge paperwork.   Final Clinical Impressions(s) / ED Diagnoses   Final diagnoses:  Left leg pain  Pregnancy, unspecified gestational age    New Prescriptions Discharge Medication List as of 08/31/2016  3:29 PM     I  personally performed the services described in this documentation, which was scribed in my presence. The recorded information has been reviewed and is accurate.     Cristina Gong, PA-C 09/01/16 1002    Bethann Berkshire, MD 09/08/16 2140

## 2016-08-31 NOTE — ED Notes (Signed)
Pt states that she is feeling unwell and tired.  Gave her 2 warm blankets and repositioned for comfort.  Apologized for the delay.  Family remains supportive at the bedside

## 2016-08-31 NOTE — Discharge Instructions (Signed)
Please get prenatal vitamins and take them daily

## 2016-08-31 NOTE — ED Triage Notes (Signed)
PT C/O RIGHT LOWER BACK PAIN RADIATING DOWN TO THE RIGHT LEG AND FOOT X10 DAYS. PT DENIES INJURY.

## 2016-09-12 ENCOUNTER — Emergency Department (HOSPITAL_COMMUNITY)
Admission: EM | Admit: 2016-09-12 | Discharge: 2016-09-12 | Disposition: A | Discharge status: Home / Self Care | Payer: Medicaid Other | Attending: Emergency Medicine | Admitting: Emergency Medicine

## 2016-09-12 ENCOUNTER — Encounter (HOSPITAL_COMMUNITY): Payer: Self-pay | Admitting: Emergency Medicine

## 2016-09-12 DIAGNOSIS — R112 Nausea with vomiting, unspecified: Secondary | ICD-10-CM

## 2016-09-12 DIAGNOSIS — O219 Vomiting of pregnancy, unspecified: Secondary | ICD-10-CM | POA: Insufficient documentation

## 2016-09-12 DIAGNOSIS — O0281 Inappropriate change in quantitative human chorionic gonadotropin (hCG) in early pregnancy: Secondary | ICD-10-CM | POA: Insufficient documentation

## 2016-09-12 DIAGNOSIS — Z3A10 10 weeks gestation of pregnancy: Secondary | ICD-10-CM | POA: Diagnosis not present

## 2016-09-12 DIAGNOSIS — M545 Low back pain: Secondary | ICD-10-CM | POA: Insufficient documentation

## 2016-09-12 DIAGNOSIS — R197 Diarrhea, unspecified: Secondary | ICD-10-CM

## 2016-09-12 LAB — COMPREHENSIVE METABOLIC PANEL
ALBUMIN: 4.3 g/dL (ref 3.5–5.0)
ALK PHOS: 61 U/L (ref 38–126)
ALT: 19 U/L (ref 14–54)
ANION GAP: 10 (ref 5–15)
AST: 26 U/L (ref 15–41)
BILIRUBIN TOTAL: 0.4 mg/dL (ref 0.3–1.2)
BUN: 10 mg/dL (ref 6–20)
CO2: 24 mmol/L (ref 22–32)
Calcium: 9.2 mg/dL (ref 8.9–10.3)
Chloride: 102 mmol/L (ref 101–111)
Creatinine, Ser: 0.4 mg/dL — ABNORMAL LOW (ref 0.44–1.00)
GFR calc Af Amer: >60 mL/min (ref >60)
GFR calc non Af Amer: >60 mL/min (ref >60)
Glucose, Bld: 91 mg/dL (ref 65–99)
POTASSIUM: 3.6 mmol/L (ref 3.5–5.1)
SODIUM: 136 mmol/L (ref 135–145)
Total Protein: 8 g/dL (ref 6.5–8.1)

## 2016-09-12 LAB — URINALYSIS, ROUTINE W REFLEX MICROSCOPIC
Bilirubin Urine: NEGATIVE
GLUCOSE, UA: NEGATIVE mg/dL
HGB URINE DIPSTICK: NEGATIVE
Ketones, ur: 5 mg/dL — AB
LEUKOCYTES UA: NEGATIVE
Nitrite: NEGATIVE
PH: 6 (ref 5.0–8.0)
Protein, ur: NEGATIVE mg/dL
Specific Gravity, Urine: 1.013 (ref 1.005–1.030)

## 2016-09-12 LAB — CBC WITH DIFFERENTIAL/PLATELET
BASOS ABS: 0 10*3/uL (ref 0.0–0.1)
Basophils Relative: 0 %
EOS ABS: 0 10*3/uL (ref 0.0–0.7)
Eosinophils Relative: 1 %
HEMATOCRIT: 37.9 % (ref 36.0–46.0)
HEMOGLOBIN: 12.8 g/dL (ref 12.0–15.0)
Lymphocytes Relative: 17 %
Lymphs Abs: 0.7 10*3/uL (ref 0.7–4.0)
MCH: 29.4 pg (ref 26.0–34.0)
MCHC: 33.8 g/dL (ref 30.0–36.0)
MCV: 86.9 fL (ref 78.0–100.0)
Monocytes Absolute: 0.4 10*3/uL (ref 0.1–1.0)
Monocytes Relative: 9 %
NEUTROS ABS: 3 10*3/uL (ref 1.7–7.7)
NEUTROS PCT: 73 %
Platelets: 195 10*3/uL (ref 150–400)
RBC: 4.36 MIL/uL (ref 3.87–5.11)
RDW: 12.8 % (ref 11.5–15.5)
WBC: 4.1 10*3/uL (ref 4.0–10.5)

## 2016-09-12 LAB — HCG, QUANTITATIVE, PREGNANCY: hCG, Beta Chain, Quant, S: 85668 m[IU]/mL — ABNORMAL HIGH (ref <5)

## 2016-09-12 MED ORDER — ONDANSETRON HCL 4 MG/2ML IJ SOLN
4.0000 mg | Freq: Once | INTRAMUSCULAR | Status: AC
  Administered 2016-09-12: 4 mg via INTRAVENOUS
  Filled 2016-09-12: qty 2

## 2016-09-12 MED ORDER — METOCLOPRAMIDE HCL 10 MG PO TABS: 10.0000 mg | ORAL_TABLET | Freq: Four times a day (QID) | ORAL | 0 refills | Status: DC | PRN

## 2016-09-12 MED ORDER — SODIUM CHLORIDE 0.9 % IV BOLUS (SEPSIS)
1000.0000 mL | Freq: Once | INTRAVENOUS | Status: AC
  Administered 2016-09-12: 1000 mL via INTRAVENOUS

## 2016-09-12 NOTE — ED Triage Notes (Signed)
Pt states she is feeling short of breath, has a headache, abd pain, vomiting, diarrhea, and fever  Pt states sxs started yesterday  Pt states she is pregnant and thinks she is in her 2nd month  Denies any vaginal spotting or bleeding  Triage obtained using interpretor online

## 2016-09-12 NOTE — ED Provider Notes (Signed)
[facility] DEPT Provider Note   CSN: [id number] Arrival date & time: 09/12/16  4098     History   Chief Complaint Chief Complaint  Patient presents with  . Shortness of Breath  . Abdominal Pain  . Emesis    HPI Sharon Hall is a 26 y.o. female.  Presents with complaints of abdominal pain, nausea, vomiting, diarrhea. Symptoms present for 2 days. She thinks she has had a fever and has had concomitant headache. Pain is across the upper abdomen. Pain comes and goes. She has not identified alleviating or exacerbating factors. Patient does report that she is pregnant. She was seen approximately a week ago in the ER and diagnosed as pregnant. She is not sure how far along she has, thinks approximately 2 months. Patient was seen previously for low back pain. Pain is in the middle of the lower back, more to the right side. This is continuous and has been present without relief since she was seen. She does report that she has felt shortness of breath associated with the abdominal pain. This has not been continuous.      Past Medical History:  Diagnosis Date  . Medical history non-contributory     Patient Active Problem List   Diagnosis Date Noted  . Active labor 06/02/2015    Past Surgical History:  Procedure Laterality Date  . NO PAST SURGERIES      OB History    Gravida Para Term Preterm AB Living   SAB TAB Ectopic Multiple Live Births         0 1       Home Medications    Prior to Admission medications   Medication Sig Start Date End Date Taking? Authorizing Provider  acetaminophen (TYLENOL) 325 MG tablet Take 2 tablets (650 mg total) by mouth every 4 (four) hours as needed (for pain scale < 4ORtemperature>/=100.5 F). 06/04/15   Kathrynn Running, MD  calcium carbonate (TUMS - DOSED IN MG ELEMENTAL CALCIUM) 500 MG chewable tablet Chew 1 tablet by mouth daily.    Historical Provider, MD  ibuprofen (ADVIL,MOTRIN) 600 MG tablet Take 1 tablet  (600 mg total) by mouth every 6 (six) hours. 06/03/15   Montez Morita, CNM  metoCLOPramide (REGLAN) 10 MG tablet Take 1 tablet (10 mg total) by mouth every 6 (six) hours as needed for nausea or vomiting. 09/12/16   Gilda Crease, MD  Prenatal Vit-Fe Fumarate-FA (MULTIVITAMIN-PRENATAL) 27-0.8 MG TABS tablet Take 1 tablet by mouth daily at 12 noon.    Historical Provider, MD  ranitidine (ZANTAC) 75 MG tablet Take 75 mg by mouth 2 (two) times daily.    Historical Provider, MD    Family History Family History  Problem Relation Age of Onset  . Diabetes Father   . Hypertension Father     Social History Social History  Substance Use Topics  . Smoking status: Never Smoker  . Smokeless tobacco: Never Used  . Alcohol use No     Allergies   Patient has no known allergies.   Review of Systems Review of Systems  Respiratory: Positive for shortness of breath.   Gastrointestinal: Positive for abdominal pain, diarrhea, nausea and vomiting.  All other systems reviewed and are negative.    Physical Exam Updated Vital Signs BP 108/75 (BP Location: Left Arm)   Pulse 99   Temp 98.2 F (36.8 C) (Oral)   Resp 16   LMP 06/30/2016  SpO2 100%   Physical Exam  Constitutional: She is oriented to person, place, and time. She appears well-developed and well-nourished. No distress.  HENT:  Head: Normocephalic and atraumatic.  Right Ear: Hearing normal.  Left Ear: Hearing normal.  Nose: Nose normal.  Mouth/Throat: Oropharynx is clear and moist and mucous membranes are normal.  Eyes: Conjunctivae and EOM are normal. Pupils are equal, round, and reactive to light.  Neck: Normal range of motion. Neck supple.  Cardiovascular: Regular rhythm, S1 normal and S2 normal.  Exam reveals no gallop and no friction rub.   No murmur heard. Pulmonary/Chest: Effort normal and breath sounds normal. No respiratory distress. She exhibits no tenderness.  Abdominal: Soft. Normal appearance and bowel  sounds are normal. There is no hepatosplenomegaly. There is tenderness in the epigastric area. There is no rebound, no guarding, no tenderness at McBurney's point and negative Murphy's sign. No hernia.  Musculoskeletal: Normal range of motion.       Lumbar back: She exhibits tenderness.       Back:  Neurological: She is alert and oriented to person, place, and time. She has normal strength. No cranial nerve deficit or sensory deficit. Coordination normal. GCS eye subscore is 4. GCS verbal subscore is 5. GCS motor subscore is 6.  Skin: Skin is warm, dry and intact. No rash noted. No cyanosis.  Psychiatric: She has a normal mood and affect. Her speech is normal and behavior is normal. Thought content normal.  Nursing note and vitals reviewed.    ED Treatments / Results  Labs (all labs ordered are listed, but only abnormal results are displayed) Labs Reviewed  COMPREHENSIVE METABOLIC PANEL - Abnormal; Notable for the following:       Result Value   Creatinine, Ser 0.40 (*)    All other components within normal limits  URINALYSIS, ROUTINE W REFLEX MICROSCOPIC - Abnormal; Notable for the following:    Ketones, ur 5 (*)    All other components within normal limits  HCG, QUANTITATIVE, PREGNANCY - Abnormal; Notable for the following:    hCG, Beta Chain, Quant, S 16,109 (*)    All other components within normal limits  CBC WITH DIFFERENTIAL/PLATELET    EKG  EKG Interpretation None       Radiology No results found.  Procedures Procedures (including critical care time)  Medications Ordered in ED Medications  sodium chloride 0.9 % bolus 1,000 mL (0 mLs Intravenous Stopped 09/12/16 0634)  ondansetron (ZOFRAN) injection 4 mg (4 mg Intravenous Given 09/12/16 0554)     Initial Impression / Assessment and Plan / ED Course  I have reviewed the triage vital signs and the nursing notes.  Pertinent labs & imaging results that were available during my care of the patient were reviewed by  me and considered in my medical decision making (see chart for details).     Patient presents to the emergency department for evaluation of multiple problems. Patient has been experiencing upper abdominal pain with nausea, vomiting and diarrhea. She has felt like she was having a fever. She has not taken her temperature, however. She is afebrile here in the ER. Vital signs are normal. She reported shortness of breath previously, but is not rigid shortness of breath now. Oxygenation is normal, lungs are clear. No recent travel or leg swelling. Doubt PE.  Patient is pregnant. She is 10 weeks 4 days by last menstrual period. Pain is above the umbilicus. She is not expressing any lower abdominal or pelvic pain. No vaginal  bleeding, discharge. As she is experiencing vomiting and diarrhea, do not feel that this is secondary to obstetric complication. No concern for ectopic pregnancy.  Patient administered IV fluids and Zofran and her abdominal symptoms have completely resolved. Patient has scheduled follow-up with OB/GYN, encouraged to keep the appointment. She was counseled that she can only use Tylenol for pain. Was prescribed Reglan for nausea and vomiting.  Final Clinical Impressions(s) / ED Diagnoses   Final diagnoses:  Nausea vomiting and diarrhea  [redacted] weeks gestation of pregnancy    New Prescriptions New Prescriptions   METOCLOPRAMIDE (REGLAN) 10 MG TABLET    Take 1 tablet (10 mg total) by mouth every 6 (six) hours as needed for nausea or vomiting.     Gilda Crease, MD 09/12/16 [phone]

## 2016-09-12 NOTE — ED Notes (Signed)
ED Provider at bedside to discuss disposition via video interpreter

## 2016-10-15 ENCOUNTER — Other Ambulatory Visit (HOSPITAL_COMMUNITY): Payer: Self-pay | Admitting: Nurse Practitioner

## 2016-10-15 DIAGNOSIS — Z3682 Encounter for antenatal screening for nuchal translucency: Secondary | ICD-10-CM

## 2016-10-15 LAB — OB RESULTS CONSOLE HIV ANTIBODY (ROUTINE TESTING): HIV: NONREACTIVE

## 2016-10-15 LAB — OB RESULTS CONSOLE RUBELLA ANTIBODY, IGM: RUBELLA: IMMUNE

## 2016-10-15 LAB — OB RESULTS CONSOLE GC/CHLAMYDIA
Chlamydia: NEGATIVE
GC PROBE AMP, GENITAL: NEGATIVE

## 2016-10-15 LAB — OB RESULTS CONSOLE HEPATITIS B SURFACE ANTIGEN: Hepatitis B Surface Ag: NEGATIVE

## 2016-10-15 LAB — OB RESULTS CONSOLE RPR: RPR: NONREACTIVE

## 2016-10-23 ENCOUNTER — Encounter (HOSPITAL_COMMUNITY): Payer: Self-pay | Admitting: Nurse Practitioner

## 2016-10-27 ENCOUNTER — Encounter (HOSPITAL_COMMUNITY): Payer: Self-pay | Admitting: *Deleted

## 2016-10-29 ENCOUNTER — Other Ambulatory Visit (HOSPITAL_COMMUNITY): Payer: Self-pay | Admitting: Nurse Practitioner

## 2016-10-29 ENCOUNTER — Ambulatory Visit (HOSPITAL_COMMUNITY)
Admission: RE | Admit: 2016-10-29 | Discharge: 2016-10-29 | Disposition: A | Discharge status: Home / Self Care | Payer: Medicaid Other | Source: Ambulatory Visit | Attending: Nurse Practitioner | Admitting: Nurse Practitioner

## 2016-10-29 ENCOUNTER — Other Ambulatory Visit (HOSPITAL_COMMUNITY): Payer: Medicaid Other

## 2016-10-29 ENCOUNTER — Encounter (HOSPITAL_COMMUNITY): Payer: Self-pay

## 2016-10-29 DIAGNOSIS — Z363 Encounter for antenatal screening for malformations: Secondary | ICD-10-CM | POA: Diagnosis not present

## 2016-10-29 DIAGNOSIS — Z3687 Encounter for antenatal screening for uncertain dates: Secondary | ICD-10-CM | POA: Insufficient documentation

## 2016-10-29 DIAGNOSIS — Z843 Family history of consanguinity: Secondary | ICD-10-CM | POA: Diagnosis not present

## 2016-10-29 DIAGNOSIS — Z3A15 15 weeks gestation of pregnancy: Secondary | ICD-10-CM | POA: Diagnosis not present

## 2016-10-29 DIAGNOSIS — Z3682 Encounter for antenatal screening for nuchal translucency: Secondary | ICD-10-CM

## 2016-10-30 DIAGNOSIS — Z3A15 15 weeks gestation of pregnancy: Secondary | ICD-10-CM | POA: Insufficient documentation

## 2016-10-30 DIAGNOSIS — Z843 Family history of consanguinity: Secondary | ICD-10-CM | POA: Insufficient documentation

## 2016-10-30 NOTE — Progress Notes (Signed)
Genetic Counseling  High-Risk Gestation Note   Appointment Date: 10/29/2016 Referred By: Shelbie Ammons, NP  Date of Birth: 01/15/1991  Pregnancy history: G2P1001 Estimated Date of Delivery: 04/22/17  Estimated Gestational Age:  57w0dAttending: MRenella Cunas MD  I met with Sharon Hall and her husband for genetic counseling because of a family history of consanguinity. Arabic/English medical [employer] video interpreter, Marwa ([phone], provided interpretation for today's visit.   In summary:  Discussed family history of consanguinity: couple are first cousins to each other  Reviewed increased risk for autosomal recessive and multifactorial conditions  Discussed options of screening / testing  Detailed ultrasound  Expanded pan-ethnic carrier screening panel - declined  Discussed options of screening for fetal aneuploidy- patient declined maternal serum screening (First screen, Quad screen)  Discussed general population carrier screening options- declined  CF  SMA  Hemoglobinopathies  We began by reviewing the family history in detail. Ms. Sharon Hall her husband reported that they are first cousins; the patient's father and her husband's mother are full siblings to each other.  The family histories were found to be noncontributory for birth defects, intellectual disability, recurrent pregnancy loss, and known genetic conditions. The couple reported [place].   We discussed that children born to a consanguineous couple are at increased risk for genetic conditions.  This increase in risk is related to the possibility of both parents passing on a recessive gene. We explained that every person carries approximately 7-10 non-working genes that when received in a double dose results in a recessive genetic condition.  In general, unrelated couples have a relatively low risk of having a child with a recessive condition because the likelihood of both parents  carrying the same non-working autosomal recessive pathogenic change is very low.  However, when a couple is related, they have inherited some of their genetic information from the same family member, which leads to an increased chance that they may carry the same recessive gene and have a child with a recessive condition.  For first cousin unions, the risk to have a child with a birth defect, intellectual disability, or genetic condition is increased approximately 2-4% above the general population risk, (which is 3-5%).  We reviewed chromosomes, genes, and autosomal recessive inheritance in detail.   There are a myriad of genetic disorders that occur more frequently in specific ethnic groups, those which can be traced to particular geographic locations. We discussed that although these genetic disorders are much more prevalent in specific ethnic groups, as families are becoming increasingly multiracial and multicultural, these conditions can occur in anyone from any race or ethnicity. For this reason, we discussed the availability of ethnic specific genetic carrier screening, professional society (ACOG) recommended carrier testing, and pan-ethnic carrier screening.   We reviewed that ACOG currently recommends that all patients be offered carrier screening for cystic fibrosis, spinal muscular atrophy and hemoglobinopathies. In addition, they were counseled that there are a variety of genetic screening laboratories that have pan-ethnic, or expanded, carrier screening panels, which evaluate carrier status for a wide range of genetic conditions. Some of these conditions are severe and actionable, but also rare; others occur more commonly, but are less severe. We discussed that testing options range from screening for a single condition to panels of more than 200 autosomal or X-linked genetic conditions. We reviewed that the prevalence of each condition varies (and often varies with ethnicity). Thus the couples'  background risk to be a carrier for each of these various conditions  would range, and in some cases be very low or unknown. Similarly, the detection rate varies with each condition and also varies in some cases with ethnicity, ranging from greater than 99% (in the case of hemoglobinopathies) to unknown. We reviewed that a negative carrier screen would thus reduce, but not eliminate the chance to be a carrier for these conditions. For some conditions included on specific pan-ethnic carrier screening panels, the pre-test carrier frequency and/or the detection rate is unknown and similarly the phenotype for some conditions may be less well described than others. We reviewed that in the event that one partner is found to be a carrier for one or more conditions, carrier screening would be available to the partner for those conditions. For the vast majority of these conditions, carrier status does not incur increased risk for personal medical consequences, however, there are some conditions for which identified carrier status is associated with increased risk for potential medical conditions.  After thoughtful consideration of their options, this couple declined carrier screening (both ACOG recommended panel and expanded pan-ethnic carrier screening panel). Without further information regarding the provided family history, an accurate genetic risk cannot be calculated. Further genetic counseling is warranted if more information is obtained.  This couple was counseled regarding the availability of screening for fetal aneuploidy. Considering Sharon Hall's maternal age of 26 y.o. and her negative family history, we discussed that she is considered to be in the low risk category for having a fetus with aneuploidy. We reviewed chromosomes, nondisjunction, and the associated 1 in 840 risk for fetal aneuploidy related to a maternal age of 26 y.o.Marland Kitchen They were counseled that the risk for aneuploidy decreases as  gestational age increases, accounting for those pregnancies which spontaneously abort. We briefly discussed common fetal chromosome conditions including the features and prognoses of each.   We reviewed available routine screening options including First Screen, Quad screen, nuchal translucency ultrasound and detailed ultrasound. They were counseled that screening tests are used to modify a patient's a priori risk for aneuploidy, typically based on age. This estimate provides a pregnancy specific risk assessment. We reviewed the benefits and limitations of each option.  We discussed that while noninvasive prenatal screening (NIPS) and diagnostic testing should be made available to all pregnant patients, ACOG recommends that this testing be offered to those patients who are considered to have a "high" risk for fetal aneuploidy (i.e. those who are AMA, have an abnormal maternal serum or combined screening result, have an abnormal finding by fetal ultrasound, or have a family history of a specific chromosome condition). We discussed the possible results that the tests might provide including: positive, negative, unanticipated, and no result. Finally, they were counseled regarding the cost of each option and potential out of pocket expenses.   After consideration of all the options, they elected to have ultrasound, which was performed today and visualized the pregnancy to be 43w0dgestation. The report will be documented separately. First trimester screening and other specific screening for fetal aneuploidy were declined today. They understand that ultrasound cannot rule out all birth defects or genetic syndromes. The patient was advised of this limitation and states she still does not want diagnostic testing at this time.   Ms Sharon Canizalesdenied exposure to environmental toxins or chemical agents. She denied the use of alcohol, tobacco or street drugs. She denied significant viral illnesses during the course  of her pregnancy. Her medical and surgical histories were noncontributory.   I counseled this couple regarding  the above risks and available options.  The approximate face-to-face time with the genetic counselor was 40 minutes.  Chipper Oman, MS Certified Genetic Counselor 10/30/2016

## 2016-11-02 ENCOUNTER — Other Ambulatory Visit (HOSPITAL_COMMUNITY): Payer: Self-pay | Admitting: Nurse Practitioner

## 2016-11-02 DIAGNOSIS — Z3687 Encounter for antenatal screening for uncertain dates: Secondary | ICD-10-CM

## 2016-11-02 DIAGNOSIS — Z3A15 15 weeks gestation of pregnancy: Secondary | ICD-10-CM

## 2017-03-30 LAB — OB RESULTS CONSOLE GBS: STREP GROUP B AG: NEGATIVE

## 2017-04-06 ENCOUNTER — Inpatient Hospital Stay (HOSPITAL_COMMUNITY)
Admission: AD | Admit: 2017-04-06 | Discharge: 2017-04-06 | Disposition: A | Discharge status: Home / Self Care | Payer: Medicaid Other | Source: Ambulatory Visit | Attending: Obstetrics and Gynecology | Admitting: Obstetrics and Gynecology

## 2017-04-06 ENCOUNTER — Other Ambulatory Visit: Payer: Self-pay

## 2017-04-06 ENCOUNTER — Encounter (HOSPITAL_COMMUNITY): Payer: Self-pay | Admitting: *Deleted

## 2017-04-06 DIAGNOSIS — O468X3 Other antepartum hemorrhage, third trimester: Secondary | ICD-10-CM | POA: Diagnosis not present

## 2017-04-06 DIAGNOSIS — Z8249 Family history of ischemic heart disease and other diseases of the circulatory system: Secondary | ICD-10-CM | POA: Diagnosis not present

## 2017-04-06 DIAGNOSIS — N898 Other specified noninflammatory disorders of vagina: Secondary | ICD-10-CM | POA: Diagnosis present

## 2017-04-06 DIAGNOSIS — Z3A37 37 weeks gestation of pregnancy: Secondary | ICD-10-CM | POA: Diagnosis not present

## 2017-04-06 DIAGNOSIS — N939 Abnormal uterine and vaginal bleeding, unspecified: Secondary | ICD-10-CM

## 2017-04-06 DIAGNOSIS — O4693 Antepartum hemorrhage, unspecified, third trimester: Secondary | ICD-10-CM | POA: Diagnosis not present

## 2017-04-06 DIAGNOSIS — Z833 Family history of diabetes mellitus: Secondary | ICD-10-CM | POA: Diagnosis not present

## 2017-04-06 DIAGNOSIS — Z843 Family history of consanguinity: Secondary | ICD-10-CM | POA: Diagnosis not present

## 2017-04-06 LAB — WET PREP, GENITAL
CLUE CELLS WET PREP: NONE SEEN
Sperm: NONE SEEN
TRICH WET PREP: NONE SEEN
WBC, Wet Prep HPF POC: NONE SEEN
Yeast Wet Prep HPF POC: NONE SEEN

## 2017-04-06 LAB — POCT FERN TEST: POCT FERN TEST: NEGATIVE

## 2017-04-06 NOTE — MAU Note (Signed)
Pt reports bleeding since this am, pain x 12 hours.

## 2017-04-06 NOTE — Discharge Instructions (Signed)

## 2017-04-06 NOTE — MAU Provider Note (Signed)
Patient Sharon Hall is a 26 y.o. G2P1001 at 5836w5d here with complaints of a few spots of blood this morning off and on today. She denies decreased fetal movements; she has had a occasional liquidy-mucous vaginal discharge but has not had to wear a pad or change her clothes. She denies any gushes of fluid; feels occasional contractions.   [facility].  Last intercourse was 48 hours ago.   History     CSN: [id number]  Arrival date and time: 04/06/17 1543   First Provider Initiated Contact with Patient 04/06/17 1633      Chief Complaint  Patient presents with  . Labor Eval  . Vaginal Bleeding    Since 0300 this morning - vaginal bleeding; not sure if water broke  . Rupture of Membranes    "Not Sure"   Vaginal Bleeding  The patient's primary symptoms include vaginal bleeding. The patient's pertinent negatives include no genital lesions, genital odor or pelvic pain. This is a new problem. The current episode started today. The problem occurs rarely. The problem has been unchanged. The vaginal discharge was bloody. The vaginal bleeding is spotting. She has not been passing clots. She has not been passing tissue. She has tried nothing for the symptoms.    OB History    Gravida Para Term Preterm AB Living   2 1 1     1    SAB TAB Ectopic Multiple Live Births         0 1      Past Medical History:  Diagnosis Date  . Medical history non-contributory     Past Surgical History:  Procedure Laterality Date  . NO PAST SURGERIES      Family History  Problem Relation Age of Onset  . Diabetes Father   . Hypertension Father     Social History   Tobacco Use  . Smoking status: Never Smoker  . Smokeless tobacco: Never Used  Substance Use Topics  . Alcohol use: No  . Drug use: No    Allergies: No Known Allergies  Medications Prior to Admission  Medication Sig Dispense Refill Last Dose  . acetaminophen (TYLENOL) 325 MG tablet Take 2 tablets (650 mg  total) by mouth every 4 (four) hours as needed (for pain scale < 4ORtemperature>/=100.5 F). 90 tablet 3 04/05/2017 at Unknown time  . calcium carbonate (TUMS - DOSED IN MG ELEMENTAL CALCIUM) 500 MG chewable tablet Chew 1-2 tablets by mouth 3 (three) times daily as needed for indigestion or heartburn.    04/05/2017 at Unknown time  . Prenatal Vit-Fe Fumarate-FA (MULTIVITAMIN-PRENATAL) 27-0.8 MG TABS tablet Take 1 tablet by mouth daily at 12 noon.   04/06/2017 at Unknown time    Review of Systems  Constitutional: Negative.   Respiratory: Negative.   Cardiovascular: Negative.   Gastrointestinal: Negative.   Genitourinary: Positive for vaginal bleeding. Negative for pelvic pain.   Physical Exam   Blood pressure 108/69, pulse 90, temperature 98 F (36.7 C), temperature source Oral, resp. rate 17, height 5\' 3"  (1.6 m), weight 136 lb (61.7 kg), last menstrual period 07/28/2016, SpO2 100 %, unknown if currently breastfeeding.  Physical Exam  Constitutional: She is oriented to person, place, and time. She appears well-developed and well-nourished.  Neck: Normal range of motion.  GI: Soft.  Genitourinary:  Genitourinary Comments: NEFG; no lesions on vaginal walls, trace bloody mucous in the vagina, no pooling. No CMT, negative adnexal tenderness or suprapubic tenderness. Cervix is FT, posterior, closed.  Musculoskeletal: Normal range of motion.  Neurological: She is alert and oriented to person, place, and time.  Skin: Skin is warm and dry.    MAU Course  Procedures  MDM -fern negative -NST; 130 bpm, mod variability,present acel, neg decels, uterine irratability Assessment and Plan   1. Vaginal bleeding   2. Family hx-consanguinity    2. Advised patient that she may be showing signs of early labor; reviewed warning signs and when to return to the MAU.   3.  Patient stable for discharge.  4. Patient and husband verbalized understanding.    Charlesetta GaribaldiKathryn Lorraine Krystie Leiter  CNM 04/06/2017, 5:55 PM

## 2017-04-07 LAB — GC/CHLAMYDIA PROBE AMP (~~LOC~~) NOT AT ARMC
Chlamydia: NEGATIVE
NEISSERIA GONORRHEA: NEGATIVE

## 2017-04-09 ENCOUNTER — Encounter (HOSPITAL_COMMUNITY): Payer: Self-pay | Admitting: *Deleted

## 2017-04-09 ENCOUNTER — Other Ambulatory Visit: Payer: Self-pay

## 2017-04-09 ENCOUNTER — Inpatient Hospital Stay (HOSPITAL_COMMUNITY)
Admission: AD | Admit: 2017-04-09 | Discharge: 2017-04-09 | Disposition: A | Discharge status: Home / Self Care | Payer: Medicaid Other | Source: Ambulatory Visit | Attending: Obstetrics and Gynecology | Admitting: Obstetrics and Gynecology

## 2017-04-09 DIAGNOSIS — O479 False labor, unspecified: Secondary | ICD-10-CM

## 2017-04-09 DIAGNOSIS — Z843 Family history of consanguinity: Secondary | ICD-10-CM

## 2017-04-09 NOTE — MAU Note (Signed)
Pt. Present with c/o contractions since 1400 yesterday. Small amount of vaginal bleeding on 11/23 at 1900 with brown color discharge.  Positive for fetal movement, denies sudden gush of blood or fluid. EFM applied - FHR 115 - 120.

## 2017-04-09 NOTE — MAU Note (Signed)
Urine in lab 

## 2017-04-09 NOTE — Discharge Summary (Signed)
MAU Discharge Hospital Discharge Summary  Patient name: Sharon Hall Medical record number: [id number] Date of birth: Apr 27, 1991 Age: 26 y.o. Gender: female Date of Admission: 04/09/2017  Date of Discharge: 04/09/17   Admitting Physician: Catalina AntiguaPeggy Constant, MD  Primary Care Provider: Patient, No Pcp Per Consultants:   Indication for Discharge: false labor  Discharge Diagnoses/Problem List:  False labor  Disposition: to home  Discharge Condition: stable  Discharge Exam: performed by MAU staff Patient was in no acute distress.  For >1hr EFM was reassuring with FHR 115-120 and appropriate variability with no decellerations.   Mother with only minor bloody show, no gush of blood/fluid.  Vitals were all WNL.  Results/Tests Pending at Time of Discharge: n/a  Discharge Medications:  Allergies as of 04/09/2017   No Known Allergies     Medication List    TAKE these medications   acetaminophen 325 MG tablet Commonly known as:  TYLENOL Take 2 tablets (650 mg total) by mouth every 4 (four) hours as needed (for pain scale < 4ORtemperature>/=100.5 F).   calcium carbonate 500 MG chewable tablet Commonly known as:  TUMS - dosed in mg elemental calcium Chew 1-2 tablets by mouth 3 (three) times daily as needed for indigestion or heartburn.   multivitamin-prenatal 27-0.8 MG Tabs tablet Take 1 tablet by mouth daily at 12 noon.       Discharge Instructions: Please refer to Patient Instructions section of EMR for full details.  Patient was counseled important signs and symptoms that should prompt return to medical care, changes in medications, dietary instructions, activity restrictions, and follow up appointments.   Follow-Up Appointments:   Marthenia RollingBland, Ivery Nanney, DO 04/09/2017, 10:23 AM PGY-1, [facility]

## 2017-04-09 NOTE — MAU Note (Signed)
..   I have communicated with Dr. Parke SimmersBland and reviewed vital signs:  Vitals:   04/09/17 0931 04/09/17 0956  BP:  118/74  Pulse:  80  Resp:    Temp:    SpO2: 100%     Vaginal exam:  Dilation: 2.5 Effacement (%): 60 Cervical Position: Posterior Station: 0 Presentation: Vertex Exam by:: Falicity Sheets, RN ,   Also reviewed contraction pattern and that non-stress test is reactive.  It has been documented that patient is having irregular contractions  with no cervical change over one hours not indicating active labor.  Patient denies any other complaints.  Based on this report provider has given order for discharge.  A discharge order and diagnosis entered by a provider.   Labor discharge instructions reviewed with patient.

## 2017-04-10 ENCOUNTER — Inpatient Hospital Stay (HOSPITAL_COMMUNITY)
Admission: AD | Admit: 2017-04-10 | Discharge: 2017-04-10 | Disposition: A | Discharge status: Home / Self Care | Payer: Medicaid Other | Source: Ambulatory Visit | Attending: Obstetrics and Gynecology | Admitting: Obstetrics and Gynecology

## 2017-04-10 ENCOUNTER — Inpatient Hospital Stay (HOSPITAL_COMMUNITY)
Admission: AD | Admit: 2017-04-10 | Discharge: 2017-04-13 | DRG: 807 | Disposition: A | Discharge status: Home / Self Care | Payer: Medicaid Other | Source: Ambulatory Visit | Attending: Obstetrics and Gynecology | Admitting: Obstetrics and Gynecology

## 2017-04-10 DIAGNOSIS — Z3A38 38 weeks gestation of pregnancy: Secondary | ICD-10-CM

## 2017-04-10 DIAGNOSIS — Z3483 Encounter for supervision of other normal pregnancy, third trimester: Secondary | ICD-10-CM | POA: Diagnosis present

## 2017-04-10 DIAGNOSIS — Z3490 Encounter for supervision of normal pregnancy, unspecified, unspecified trimester: Secondary | ICD-10-CM

## 2017-04-10 DIAGNOSIS — O471 False labor at or after 37 completed weeks of gestation: Secondary | ICD-10-CM

## 2017-04-10 DIAGNOSIS — Z843 Family history of consanguinity: Secondary | ICD-10-CM

## 2017-04-10 MED ORDER — LACTATED RINGERS IV SOLN
INTRAVENOUS | Status: DC
  Administered 2017-04-11: 04:00:00 via INTRAVENOUS

## 2017-04-10 MED ORDER — OXYCODONE-ACETAMINOPHEN 5-325 MG PO TABS: 1.0000 | ORAL_TABLET | ORAL | Status: DC | PRN

## 2017-04-10 MED ORDER — SOD CITRATE-CITRIC ACID 500-334 MG/5ML PO SOLN: 30.0000 mL | ORAL | Status: DC | PRN

## 2017-04-10 MED ORDER — EPHEDRINE 5 MG/ML INJ
10.0000 mg | INTRAVENOUS | Status: DC | PRN
  Filled 2017-04-10: qty 2

## 2017-04-10 MED ORDER — LIDOCAINE HCL (PF) 1 % IJ SOLN
30.0000 mL | INTRAMUSCULAR | Status: DC | PRN
  Filled 2017-04-10: qty 30

## 2017-04-10 MED ORDER — PHENYLEPHRINE 40 MCG/ML (10ML) SYRINGE FOR IV PUSH (FOR BLOOD PRESSURE SUPPORT)
PREFILLED_SYRINGE | INTRAVENOUS | Status: AC
  Filled 2017-04-10: qty 10

## 2017-04-10 MED ORDER — PHENYLEPHRINE 40 MCG/ML (10ML) SYRINGE FOR IV PUSH (FOR BLOOD PRESSURE SUPPORT)
80.0000 ug | PREFILLED_SYRINGE | INTRAVENOUS | Status: DC | PRN
  Filled 2017-04-10: qty 5

## 2017-04-10 MED ORDER — LIDOCAINE HCL (PF) 1 % IJ SOLN
INTRAMUSCULAR | Status: AC
  Filled 2017-04-10: qty 30

## 2017-04-10 MED ORDER — OXYTOCIN 40 UNITS IN LACTATED RINGERS INFUSION - SIMPLE MED
2.5000 [IU]/h | INTRAVENOUS | Status: DC
  Administered 2017-04-11: 2.5 [IU]/h via INTRAVENOUS

## 2017-04-10 MED ORDER — ACETAMINOPHEN 325 MG PO TABS: 650.0000 mg | ORAL_TABLET | ORAL | Status: DC | PRN

## 2017-04-10 MED ORDER — FENTANYL 2.5 MCG/ML BUPIVACAINE 1/10 % EPIDURAL INFUSION (WH - ANES)
INTRAMUSCULAR | Status: AC
  Filled 2017-04-10: qty 100

## 2017-04-10 MED ORDER — FLEET ENEMA 7-19 GM/118ML RE ENEM: 1.0000 | ENEMA | RECTAL | Status: DC | PRN

## 2017-04-10 MED ORDER — LACTATED RINGERS IV SOLN: 500.0000 mL | INTRAVENOUS | Status: DC | PRN

## 2017-04-10 MED ORDER — ONDANSETRON HCL 4 MG/2ML IJ SOLN: 4.0000 mg | Freq: Four times a day (QID) | INTRAMUSCULAR | Status: DC | PRN

## 2017-04-10 MED ORDER — OXYTOCIN BOLUS FROM INFUSION
500.0000 mL | Freq: Once | INTRAVENOUS | Status: AC
  Administered 2017-04-11: 500 mL via INTRAVENOUS

## 2017-04-10 MED ORDER — OXYCODONE-ACETAMINOPHEN 5-325 MG PO TABS: 2.0000 | ORAL_TABLET | ORAL | Status: DC | PRN

## 2017-04-10 MED ORDER — FENTANYL 2.5 MCG/ML BUPIVACAINE 1/10 % EPIDURAL INFUSION (WH - ANES)
14.0000 mL/h | INTRAMUSCULAR | Status: DC | PRN
  Administered 2017-04-11: 14 mL/h via EPIDURAL

## 2017-04-10 MED ORDER — LACTATED RINGERS IV SOLN
500.0000 mL | Freq: Once | INTRAVENOUS | Status: AC
  Administered 2017-04-10: 500 mL via INTRAVENOUS

## 2017-04-10 MED ORDER — OXYTOCIN 40 UNITS IN LACTATED RINGERS INFUSION - SIMPLE MED
INTRAVENOUS | Status: AC
  Filled 2017-04-10: qty 1000

## 2017-04-10 MED ORDER — DIPHENHYDRAMINE HCL 50 MG/ML IJ SOLN: 12.5000 mg | INTRAMUSCULAR | Status: DC | PRN

## 2017-04-10 NOTE — Progress Notes (Signed)
Dr Parke SimmersBland notified of pt's admission and status. Aware of sve, home at 1700 after labor ck and 4.5cm then and 8cm now with BOWB. Admit to BS

## 2017-04-10 NOTE — MAU Note (Signed)
Patient here for labor evaluation was 2.5 cm/60 yesterday today 3-4/60 bloody show, per Philipp DeputyKim Shaw CNM watch patient for another hour or so and recheck.

## 2017-04-10 NOTE — MAU Note (Signed)
Seen MAU earlier today for ctxs and sent home about 1500. 4.5cm . Ctxs now stronger and closer with bloody show.

## 2017-04-10 NOTE — Discharge Instructions (Signed)

## 2017-04-10 NOTE — MAU Note (Signed)
Report called to Sharon DeputyKim Shaw CNM patient's cervix 4-5/60 fhr reactive after extended period of monitoring, received order to ambulate.

## 2017-04-10 NOTE — H&P (Signed)
LABOR AND DELIVERY ADMISSION HISTORY AND PHYSICAL NOTE  Sharon Hall is a 26 y.o. female G2P1001 with IUP at 3874w2d by US presenting for SOL.  She reports positive fetal movement. She denies leakage of fluid or vaginal bleeding.  Prenatal History/Complications: [facility], incomplete prenatal testing Pregnancy complications:  - cosanguinity  Past Medical History: Past Medical History:  Diagnosis Date  . Medical history non-contributory     Past Surgical History: Past Surgical History:  Procedure Laterality Date  . NO PAST SURGERIES      Obstetrical History: OB History    Gravida Para Term Preterm AB Living   2 1 1     1    SAB TAB Ectopic Multiple Live Births         0 1      Social History: Social History   Socioeconomic History  . Marital status: Married    Spouse name: Not on file  . Number of children: Not on file  . Years of education: Not on file  . Highest education level: Not on file  Social Needs  . Financial resource strain: Not on file  . Food insecurity - worry: Not on file  . Food insecurity - inability: Not on file  . Transportation needs - medical: Not on file  . Transportation needs - non-medical: Not on file  Occupational History  . Not on file  Tobacco Use  . Smoking status: Never Smoker  . Smokeless tobacco: Never Used  Substance and Sexual Activity  . Alcohol use: No  . Drug use: No  . Sexual activity: Yes    Birth control/protection: None  Other Topics Concern  . Not on file  Social History Narrative  . Not on file    Family History: Family History  Problem Relation Age of Onset  . Diabetes Father   . Hypertension Father     Allergies: No Known Allergies  Medications Prior to Admission  Medication Sig Dispense Refill Last Dose  . acetaminophen (TYLENOL) 325 MG tablet Take 2 tablets (650 mg total) by mouth every 4 (four) hours as needed (for pain scale < 4ORtemperature>/=100.5 F). 90 tablet 3 04/09/2017 at Unknown  time  . calcium carbonate (TUMS - DOSED IN MG ELEMENTAL CALCIUM) 500 MG chewable tablet Chew 1-2 tablets by mouth 3 (three) times daily as needed for indigestion or heartburn.    04/10/2017 at Unknown time  . Prenatal Vit-Fe Fumarate-FA (PRENATAL MULTIVITAMIN) TABS tablet Take 1 tablet by mouth daily at 12 noon.   04/09/2017 at Unknown time     Review of Systems  All systems reviewed and negative except as stated in HPI  Physical Exam Blood pressure 122/63, pulse 89, temperature 97.9 F (36.6 C), resp. rate 20, height 5\' 3"  (1.6 m), weight 141 lb (64 kg), last menstrual period 07/28/2016, SpO2 100 %, unknown if currently breastfeeding. General appearance: alert, cooperative and appears stated age Lungs: clear to auscultation bilaterally Heart: regular rate and rhythm Abdomen: soft, non-tender; bowel sounds normal Extremities: No calf swelling or tenderness Presentation: cephalic Fetal monitoring: cat1  Uterine activity: 3-8 Dilation: 8 Effacement (%): 100  Prenatal labs: ABO, Rh:  ordered Antibody:  ordered Rubella: Immune (05/31 0000) RPR: Nonreactive (05/31 0000)  HBsAg: Negative (05/31 0000)  HIV: Non-reactive (05/31 0000)  GC/Chlamydia: neg GBS: Negative (11/13 0000)  1 hr Glucola: no result Genetic screening:  No result Anatomy US: normal  Prenatal Transfer Tool  Maternal Diabetes: no results on record Genetic Screening: no results on  record Maternal Ultrasounds/Referrals: Normal Fetal Ultrasounds or other Referrals:  None Maternal Substance Abuse:  No Significant Maternal Medications:  None Significant Maternal Lab Results: None  No results found for this or any previous visit (from the past 24 hour(s)).  Patient Active Problem List   Diagnosis Date Noted  . Pregnancy 04/10/2017  . False labor 04/09/2017  . Family hx-consanguinity 10/30/2016  . [redacted] weeks gestation of pregnancy   . Active labor 06/02/2015    Assessment: Sharon Hall is a 26 y.o. G2P1001  at 7335w2d here for SOL  #Labor: expectant management #Pain: Epidural planned #FWB: Cat 1 #ID:  gbs neg #MOF: breast #MOC:undetermined #Circ:    Marthenia RollingScott Bland 04/10/2017, 11:45 PM  CNM attestation:  I have seen and examined this patient; I agree with above documentation in the resident's note.   Sharon Hall is a 26 y.o. G2P2002 here for SOL, advanced labor  PE: BP 117/69 (BP Location: Right Arm)   Pulse 65   Temp 99.5 F (37.5 C) (Oral)   Resp 16   Ht 5\' 3"  (1.6 m)   Wt 64 kg (141 lb)   LMP 07/28/2016   SpO2 100%   Breastfeeding? Unknown   BMI 24.98 kg/m   Resp: normal effort, no distress Abd: gravid  ROS, labs, PMH reviewed  [facility]: [facility] Expectant management GBS neg  Anticipate SVD  Cam HaiSHAW, Stefan Karen CNM 04/11/2017, 7:46 AM

## 2017-04-11 ENCOUNTER — Inpatient Hospital Stay (HOSPITAL_COMMUNITY): Payer: Medicaid Other | Admitting: Anesthesiology

## 2017-04-11 ENCOUNTER — Encounter (HOSPITAL_COMMUNITY): Payer: Self-pay | Admitting: *Deleted

## 2017-04-11 DIAGNOSIS — Z3A38 38 weeks gestation of pregnancy: Secondary | ICD-10-CM

## 2017-04-11 LAB — CBC
HCT: 38.1 % (ref 36.0–46.0)
Hemoglobin: 12.4 g/dL (ref 12.0–15.0)
MCH: 30.2 pg (ref 26.0–34.0)
MCHC: 32.5 g/dL (ref 30.0–36.0)
MCV: 92.9 fL (ref 78.0–100.0)
PLATELETS: 172 10*3/uL (ref 150–400)
RBC: 4.1 MIL/uL (ref 3.87–5.11)
RDW: 14.5 % (ref 11.5–15.5)
WBC: 13.1 10*3/uL — ABNORMAL HIGH (ref 4.0–10.5)

## 2017-04-11 LAB — TYPE AND SCREEN
ABO/RH(D): A POS
Antibody Screen: NEGATIVE

## 2017-04-11 LAB — RPR: RPR Ser Ql: NONREACTIVE

## 2017-04-11 MED ORDER — DIBUCAINE 1 % RE OINT: 1.0000 "application " | TOPICAL_OINTMENT | RECTAL | Status: DC | PRN

## 2017-04-11 MED ORDER — IBUPROFEN 600 MG PO TABS
600.0000 mg | ORAL_TABLET | Freq: Four times a day (QID) | ORAL | Status: DC
  Administered 2017-04-11 – 2017-04-13 (×7): 600 mg via ORAL
  Filled 2017-04-11 (×8): qty 1

## 2017-04-11 MED ORDER — ACETAMINOPHEN 325 MG PO TABS: 650.0000 mg | ORAL_TABLET | ORAL | Status: DC | PRN

## 2017-04-11 MED ORDER — LIDOCAINE HCL (PF) 1 % IJ SOLN
INTRAMUSCULAR | Status: DC | PRN
  Administered 2017-04-11: 13 mL via EPIDURAL

## 2017-04-11 MED ORDER — WITCH HAZEL-GLYCERIN EX PADS: 1.0000 "application " | MEDICATED_PAD | CUTANEOUS | Status: DC | PRN

## 2017-04-11 MED ORDER — OXYCODONE HCL 5 MG PO TABS: 5.0000 mg | ORAL_TABLET | ORAL | Status: DC | PRN

## 2017-04-11 MED ORDER — SENNOSIDES-DOCUSATE SODIUM 8.6-50 MG PO TABS
2.0000 | ORAL_TABLET | ORAL | Status: DC
  Administered 2017-04-12 – 2017-04-13 (×2): 2 via ORAL
  Filled 2017-04-11 (×2): qty 2

## 2017-04-11 MED ORDER — ZOLPIDEM TARTRATE 5 MG PO TABS: 5.0000 mg | ORAL_TABLET | Freq: Every evening | ORAL | Status: DC | PRN

## 2017-04-11 MED ORDER — ONDANSETRON HCL 4 MG/2ML IJ SOLN
4.0000 mg | INTRAMUSCULAR | Status: DC | PRN
Start: 2017-04-11 — End: 2017-04-13

## 2017-04-11 MED ORDER — COCONUT OIL OIL: 1.0000 "application " | TOPICAL_OIL | Status: DC | PRN

## 2017-04-11 MED ORDER — TETANUS-DIPHTH-ACELL PERTUSSIS 5-2.5-18.5 LF-MCG/0.5 IM SUSP: 0.5000 mL | Freq: Once | INTRAMUSCULAR | Status: DC

## 2017-04-11 MED ORDER — DIPHENHYDRAMINE HCL 25 MG PO CAPS: 25.0000 mg | ORAL_CAPSULE | Freq: Four times a day (QID) | ORAL | Status: DC | PRN

## 2017-04-11 MED ORDER — SIMETHICONE 80 MG PO CHEW: 80.0000 mg | CHEWABLE_TABLET | ORAL | Status: DC | PRN

## 2017-04-11 MED ORDER — BENZOCAINE-MENTHOL 20-0.5 % EX AERO: 1.0000 "application " | INHALATION_SPRAY | CUTANEOUS | Status: DC | PRN

## 2017-04-11 MED ORDER — PRENATAL MULTIVITAMIN CH
1.0000 | ORAL_TABLET | Freq: Every day | ORAL | Status: DC
  Administered 2017-04-11 – 2017-04-12 (×2): 1 via ORAL
  Filled 2017-04-11 (×2): qty 1

## 2017-04-11 MED ORDER — ONDANSETRON HCL 4 MG PO TABS: 4.0000 mg | ORAL_TABLET | ORAL | Status: DC | PRN

## 2017-04-11 NOTE — Anesthesia Preprocedure Evaluation (Signed)

## 2017-04-11 NOTE — Progress Notes (Signed)
Sharon Hall is a 26 y.o. G2P1001 at 981w3d.  Subjective: Patient requesting AROM, pain is well controlled  Objective: BP 127/75   Pulse 97   Temp 98.5 F (36.9 C) (Oral)   Resp 18   Ht 5\' 3"  (1.6 m)   Wt 141 lb (64 kg)   LMP 07/28/2016   SpO2 100%   BMI 24.98 kg/m    FHT:  FHR: 120 bpm, variability: appropriate,  accelerations:  15x15,  decelerations:  none UC:   Q 2-703minutes,  Dilation: 9 Effacement (%): 100 Cervical Position: Middle Station: -2 Presentation: Vertex Exam by:: Kiani Wurtzel,MD  Labs: Results for orders placed or performed during the hospital encounter of 04/10/17 (from the past 24 hour(s))  CBC     Status: Abnormal   Collection Time: 04/10/17 11:42 PM  Result Value Ref Range   WBC 13.1 (H) 4.0 - 10.5 K/uL   RBC 4.10 3.87 - 5.11 MIL/uL   Hemoglobin 12.4 12.0 - 15.0 g/dL   HCT 16.138.1 09.636.0 - 04.546.0 %   MCV 92.9 78.0 - 100.0 fL   MCH 30.2 26.0 - 34.0 pg   MCHC 32.5 30.0 - 36.0 g/dL   RDW 40.914.5 81.111.5 - 91.415.5 %   Platelets 172 150 - 400 K/uL  Type and screen [facility]     Status: None   Collection Time: 04/10/17 11:42 PM  Result Value Ref Range   ABO/RH(D) A POS    Antibody Screen NEG    Sample Expiration 04/13/2017     Assessment / Plan:  AROM, clear fluid 3081w3d week IUP Labor: expectant management Fetal Wellbeing:  Category 1 Pain Control:  epidural Anticipated MOD:  SVD  Marthenia RollingBland, Yafet Cline, DO 04/11/2017 4:28 AM

## 2017-04-11 NOTE — Anesthesia Procedure Notes (Signed)
Epidural Patient location during procedure: OB Start time: 04/11/2017 12:28 AM End time: 04/11/2017 12:42 AM  Staffing Anesthesiologist: Lowella CurbMiller, Eknoor Novack Ray, MD Performed: anesthesiologist   Preanesthetic Checklist Completed: patient identified, site marked, surgical consent, pre-op evaluation, timeout performed, IV checked, risks and benefits discussed and monitors and equipment checked  Epidural Patient position: sitting Prep: ChloraPrep Patient monitoring: heart rate, cardiac monitor, continuous pulse ox and blood pressure Approach: midline Location: L2-L3 Injection technique: LOR saline  Needle:  Needle type: Tuohy  Needle gauge: 17 G Needle length: 9 cm Needle insertion depth: 4 cm Catheter type: closed end flexible Catheter size: 20 Guage Catheter at skin depth: 7 cm Test dose: negative  Assessment Events: blood not aspirated, injection not painful, no injection resistance, negative IV test and no paresthesia  Additional Notes Reason for block:procedure for pain

## 2017-04-11 NOTE — Lactation Note (Signed)
This note was copied from a baby's chart. Lactation Consultation Note  Patient Name: Sharon Hall ZOXWR'UToday's Date: 04/11/2017 Reason for consult: Initial assessment;Early term 2537-38.6wks  Visited with P2 Mom.  Baby 14 hrs old, and hasn't latched and fed yet.  Baby swaddled, and clothed.  With FOB interpreting, explained importance of baby being STS until after the first feeding.  Hand expression demonstrated, colostrum easy to express.  Placed baby prone on Mom's chest.  No rooting noted.  Baby content to fall asleep.  Showed Mom how to sandwich up breast, and stimulate upper lip to entice baby to open widely.   Encouraged hand expression and spoon feeding every 2-3 hrs until baby able to latch and breastfeed.    Lactation brochure left with Mom.  Informed of OP lactation services available to her.    Encouraged Mom to call prn for help.  Maternal Data Formula Feeding for Exclusion: Yes Reason for exclusion: Mother's choice to formula and breast feed on admission Has patient been taught Hand Expression?: Yes Does the patient have breastfeeding experience prior to this delivery?: Yes  Feeding Feeding Type: Breast Fed  LATCH Score Latch: Too sleepy or reluctant, no latch achieved, no sucking elicited.  Audible Swallowing: None  Type of Nipple: Everted at rest and after stimulation  Comfort (Breast/Nipple): Soft / non-tender  Hold (Positioning): Full assist, staff holds infant at breast  LATCH Score: 4  Interventions Interventions: Breast feeding basics reviewed;Skin to skin;Breast massage;Hand express;Expressed milk(Taught mom hand expression. Mom able to express colostrum.)  Lactation Tools Discussed/Used   spoon   Consult Status Consult Status: Follow-up Date: 04/12/17 Follow-up type: In-patient    Judee ClaraSmith, Mikeyla Music E 04/11/2017, 8:34 PM

## 2017-04-11 NOTE — Anesthesia Postprocedure Evaluation (Signed)
Anesthesia Post Note  Patient: Customer service managerihal Nigh  Procedure(s) Performed: AN AD HOC LABOR EPIDURAL     Patient location during evaluation: Mother Baby Anesthesia Type: Epidural Level of consciousness: awake and alert Pain management: pain level controlled Vital Signs Assessment: post-procedure vital signs reviewed and stable Respiratory status: spontaneous breathing, nonlabored ventilation and respiratory function stable Cardiovascular status: stable Postop Assessment: no headache, no backache, epidural receding and patient able to bend at knees Anesthetic complications: no    Last Vitals:  Vitals:   04/11/17 1230 04/11/17 1739  BP: (!) 98/55 (!) 96/52  Pulse: 79 62  Resp: 16 16  Temp: 36.8 C 36.4 C  SpO2: 100%     Last Pain:  Vitals:   04/11/17 1739  TempSrc: Oral  PainSc:    Pain Goal:                 Rica RecordsICKELTON,Wyman Meschke

## 2017-04-12 NOTE — Progress Notes (Signed)
POSTPARTUM PROGRESS NOTE  Post Partum Day 1  Subjective:  Sharon Hall is a 26 y.o. [id number] s/p NSVD at 5136w3d.  No acute events overnight.  Pt denies problems with ambulating, voiding or po intake.  She denies nausea or vomiting.  Pain is well controlled.  Lochia Small.   Objective: Blood pressure (!) 113/96, pulse 83, temperature 97.7 F (36.5 C), temperature source Oral, resp. rate 16, height 5\' 3"  (1.6 m), weight 141 lb (64 kg), last menstrual period 07/28/2016, SpO2 100 %, unknown if currently breastfeeding.  Physical Exam:  General: alert, cooperative and no distress Chest: no respiratory distress Heart:regular rate, distal pulses intact Abdomen: soft, nontender,  Uterine Fundus: firm, appropriately tender DVT Evaluation: No calf swelling or tenderness Extremities: no edema  Recent Labs    04/10/17 2342  HGB 12.4  HCT 38.1    Assessment/Plan: Sharon Hall is a 26 y.o. [id number] s/p SVD at 5936w3d   PPD#1 - Doing well Contraception: Nexplanon Feeding: breast and bottle Circ: outpatient Dispo: Plan for discharge tomorrow.   LOS: 2 days   Kandra NicolasJulie P DegeleMD 04/12/2017, 9:11 AM

## 2017-04-12 NOTE — Lactation Note (Signed)
This note was copied from a baby's chart. Lactation Consultation Note  Patient Name: Sharon Hall ZOXWR'UToday's Date: 04/12/2017  Mom reports baby is latching and BF well.  Denies questions or concerns.  Encouraged to call for assist prn.   Maternal Data    Feeding Feeding Type: Breast Fed Length of feed: 30 min  LATCH Score Latch: Repeated attempts needed to sustain latch, nipple held in mouth throughout feeding, stimulation needed to elicit sucking reflex.  Audible Swallowing: A few with stimulation  Type of Nipple: Everted at rest and after stimulation  Comfort (Breast/Nipple): Soft / non-tender  Hold (Positioning): No assistance needed to correctly position infant at breast.  LATCH Score: 8  Interventions    Lactation Tools Discussed/Used     Consult Status      Huston FoleyMOULDEN, Mikahla Wisor S 04/12/2017, 5:29 PM

## 2017-04-13 NOTE — Lactation Note (Signed)
This note was copied from a baby's chart. Lactation Consultation Note  Patient Name: Sharon Hall Reason for consult: Follow-up assessment;Early term 37-38.6wks;Infant weight loss(6% weight loss ) Baby is 4252 hours old.  LC reviewed and updated the doc flow sheets per mom and dad  Mom denies soreness, sore nipple and engorgement prevention and tx reviewed.  LC instructed mom on the use of her hand pump.  Mother informed of post-discharge support and given phone number to the lactation department, including services for phone call assistance; out-patient appointments; and breastfeeding support group. List of other breastfeeding resources in the community given in the handout. Encouraged mother to call for problems or concerns related to breastfeeding.  Maternal Data    Feeding Feeding Type: (pe rmom baby last fed at 1000 ) Nipple Type: Slow - flow Length of feed: 30 min(per mom )  LATCH Score                   Interventions Interventions: Breast feeding basics reviewed  Lactation Tools Discussed/Used Pump Review: Setup, frequency, and cleaning   Consult Status Consult Status: Complete Date: 04/13/17    Matilde SprangMargaret Ann Yossef Gilkison Hall, 10:47 AM

## 2017-04-13 NOTE — Discharge Summary (Signed)
OB Discharge Summary  *Patient and husband declined video interpretor and husband acted as Engineer, technical salesinterpretor for patient*    Patient Name: Sharon Hall DOB: 1990/07/22 MRN: [id number]  Date of admission: 04/10/2017 Delivering MD: Cam HaiSHAW, KIMBERLY D   Date of discharge: 04/13/2017  Admitting diagnosis: 38 wks ctx Intrauterine pregnancy: 2033w3d     Secondary diagnosis:  Active Problems:   Pregnancy   SVD (spontaneous vaginal delivery)  Additional problems: Consanguinity      Discharge diagnosis: Term Pregnancy Delivered                                                                                                Post partum procedures:n/a  Augmentation: AROM and Pitocin  Complications: None  Hospital course:  Onset of Labor With Vaginal Delivery     26 y.o. yo [id number] at 2033w3d was admitted in Active Labor on 04/10/2017. Patient had an uncomplicated labor course as follows:  Membrane Rupture Time/Date: 4:23 AM ,04/11/2017   Intrapartum Procedures: Episiotomy: None [1]                                         Lacerations:  Periurethral [8]  Patient had a delivery of a Viable infant. 04/11/2017  Information for the patient's newborn:  Brenton Grillslfaroog, Boy [place] [[id number]][[id number]]       Pateint had an uncomplicated postpartum course.  She is ambulating, tolerating a regular diet, passing flatus, and urinating well. Patient is discharged home in stable condition on 04/13/17.   Physical exam  Vitals:   04/11/17 1739 04/12/17 0537 04/12/17 1700 04/13/17 0611  BP: (!) 96/52 (!) 113/96 115/78 109/71  Pulse: 62 83 90 72  Resp: 16 16 14 18   Temp: 97.6 F (36.4 C) 97.7 F (36.5 C) 98.4 F (36.9 C) 98.1 F (36.7 C)  TempSrc: Oral Oral Oral Oral  SpO2:      Weight:      Height:       General: alert Lochia: appropriate Uterine Fundus: firm Incision: N/A DVT Evaluation: No evidence of DVT seen on physical exam. Labs: Lab Results  Component Value Date   WBC 13.1 (H) 04/10/2017   HGB 12.4  04/10/2017   HCT 38.1 04/10/2017   MCV 92.9 04/10/2017   PLT 172 04/10/2017   CMP Latest Ref Rng & Units 09/12/2016  Glucose 65 - 99 mg/dL 91  BUN 6 - 20 mg/dL 10  Creatinine 1.300.44 - 8.651.00 mg/dL 7.84(O0.40(L)  Sodium 962135 - 952145 mmol/L 136  Potassium 3.5 - 5.1 mmol/L 3.6  Chloride 101 - 111 mmol/L 102  CO2 22 - 32 mmol/L 24  Calcium 8.9 - 10.3 mg/dL 9.2  Total Protein 6.5 - 8.1 g/dL 8.0  Total Bilirubin 0.3 - 1.2 mg/dL 0.4  Alkaline Phos 38 - 126 U/L 61  AST 15 - 41 U/L 26  ALT 14 - 54 U/L 19    Discharge instruction: per After Visit Summary and "Baby and Me Booklet".  After visit meds:  Allergies as of 04/13/2017  No Known Allergies     Medication List    TAKE these medications   acetaminophen 325 MG tablet Commonly known as:  TYLENOL Take 2 tablets (650 mg total) by mouth every 4 (four) hours as needed (for pain scale < 4ORtemperature>/=100.5 F).   calcium carbonate 500 MG chewable tablet Commonly known as:  TUMS - dosed in mg elemental calcium Chew 1-2 tablets by mouth 3 (three) times daily as needed for indigestion or heartburn.   prenatal multivitamin Tabs tablet Take 1 tablet by mouth daily at 12 noon.       Diet: routine diet  Activity: Advance as tolerated. Pelvic rest for 6 weeks.   Outpatient follow up:6 weeks (patient and husband instructed to call health dept for appt) Follow up Appt:No future appointments. Follow up Visit:  Postpartum contraception: Nexplanon  Newborn Data: Live born female  Birth Weight: 6 lb 1.5 oz (2765 g) APGAR: 5, 8  Newborn Delivery   Birth date/time:  04/11/2017 06:06:00 Delivery type:  Vaginal, Spontaneous     Baby Feeding: both Disposition:home with mother   04/13/2017 Marthenia RollingScott Bland, DO  OB FELLOW DISCHARGE ATTESTATION  I have seen and examined this patient. I agree with above documentation and have made edits as needed.   Caryl AdaJazma Lukis Bunt, DO OB Fellow 4:07 PM

## 2017-09-19 IMAGING — US US MFM OB COMP +14 WKS
1 series · 15 of 19 positions shown · non-contrast
Comparison: none

[Series 1: us mfm ob comp +14 wks · 15 of 19 slices shown]
[im 1/19]
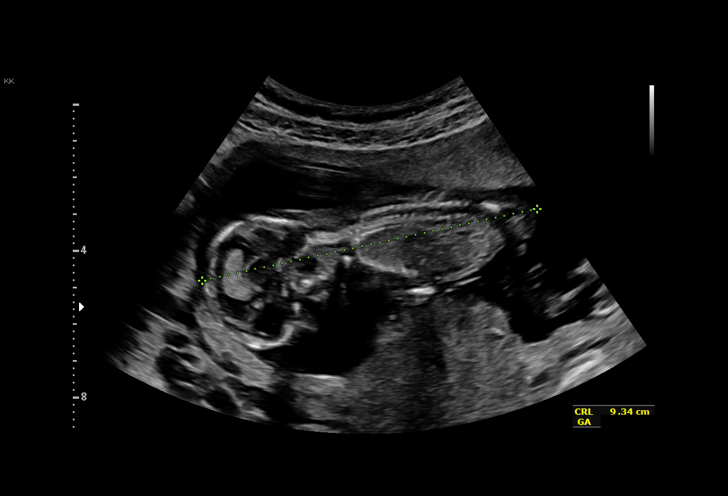
[im 2/19]
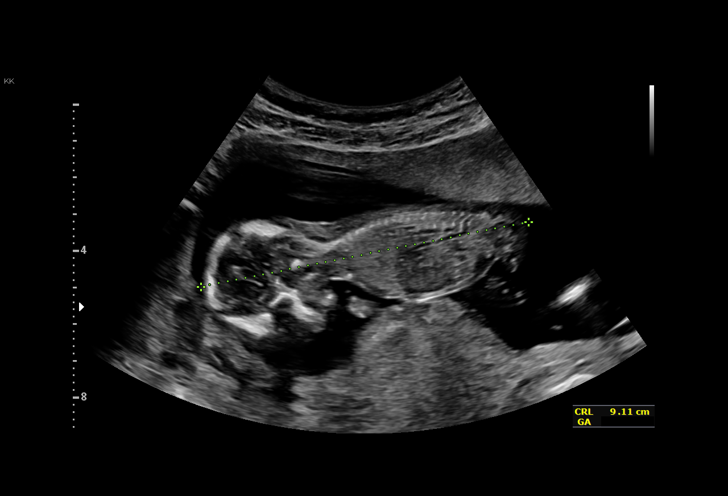
[im 4/19]
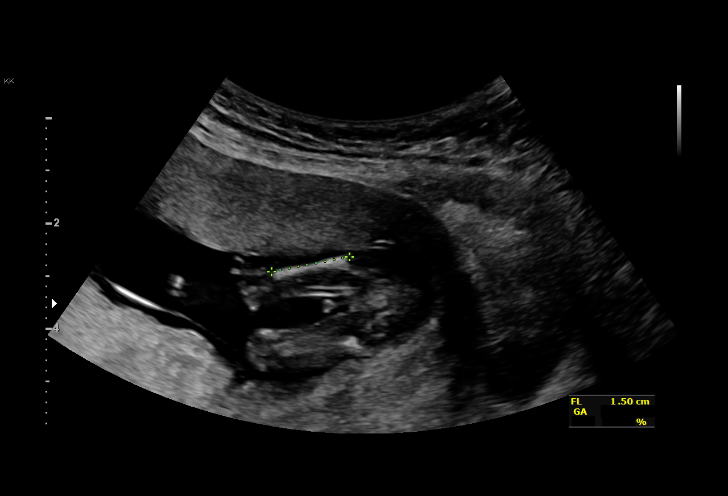
[im 5/19]
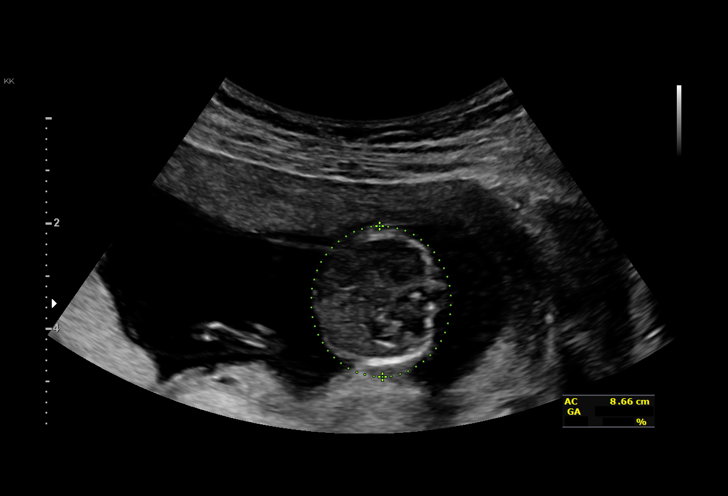
[im 6/19]
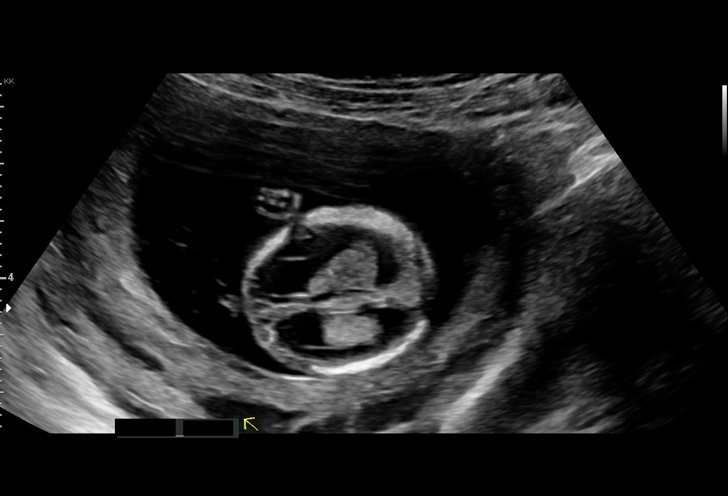
[im 7/19]
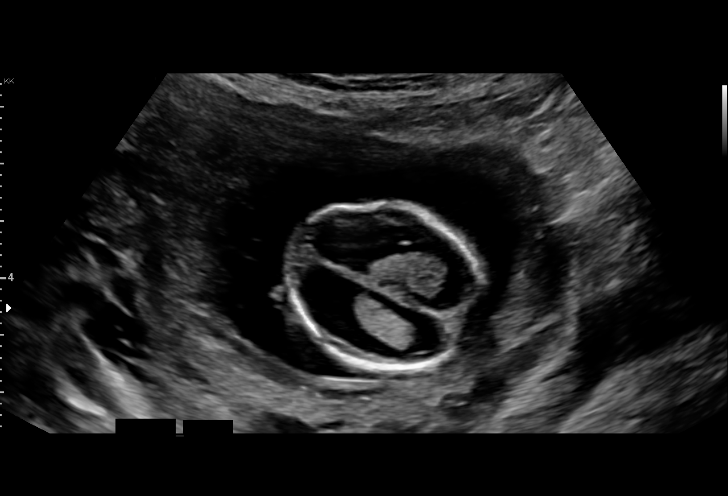
[im 9/19]
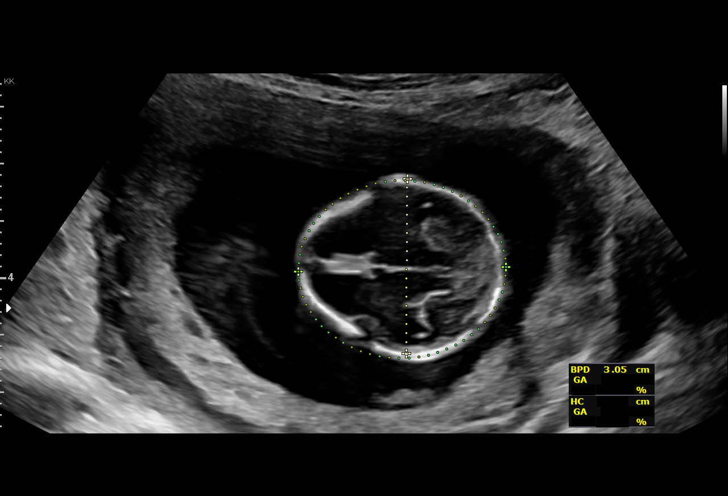
[im 10/19]
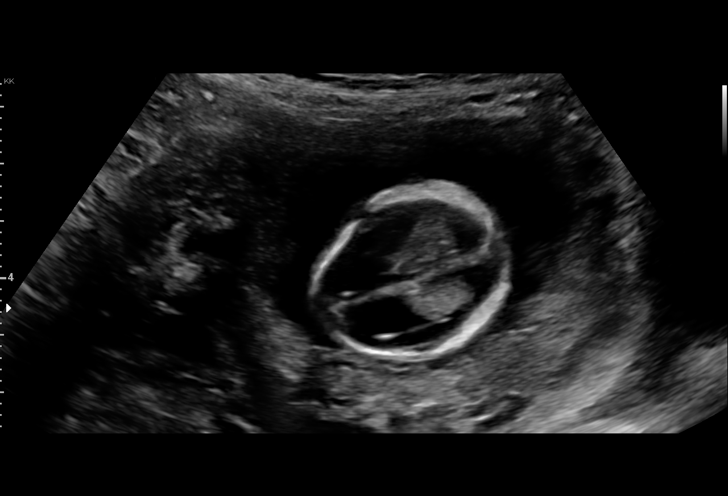
[im 11/19]
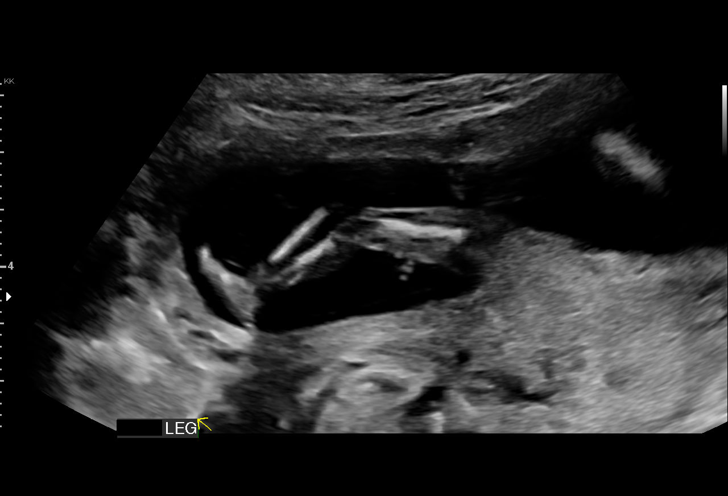
[im 13/19]
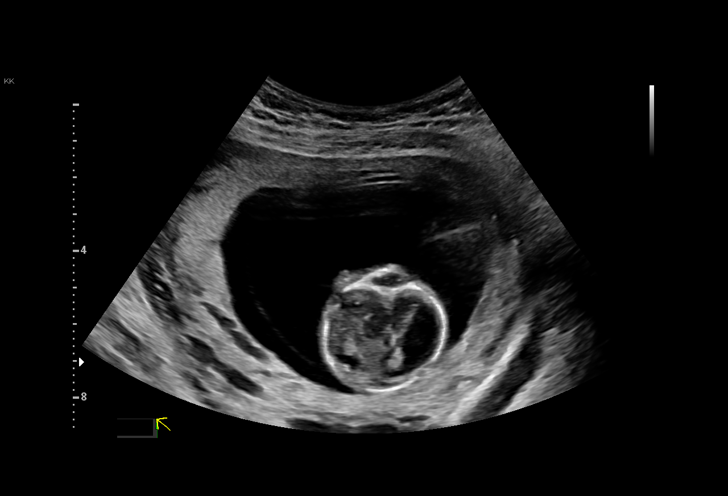
[im 14/19]
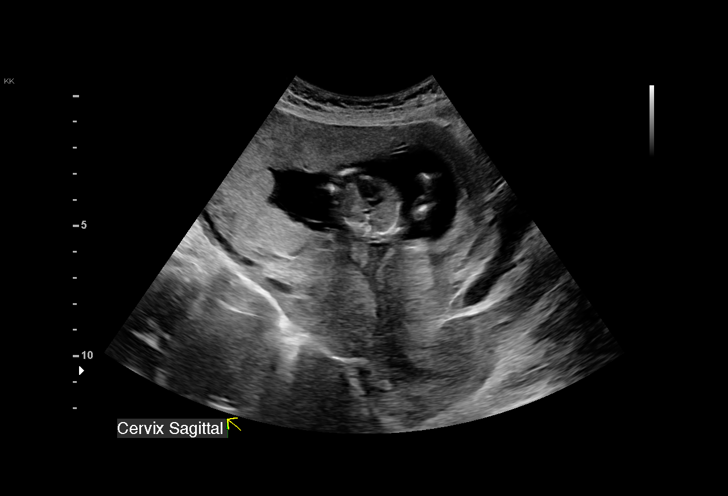
[im 15/19]
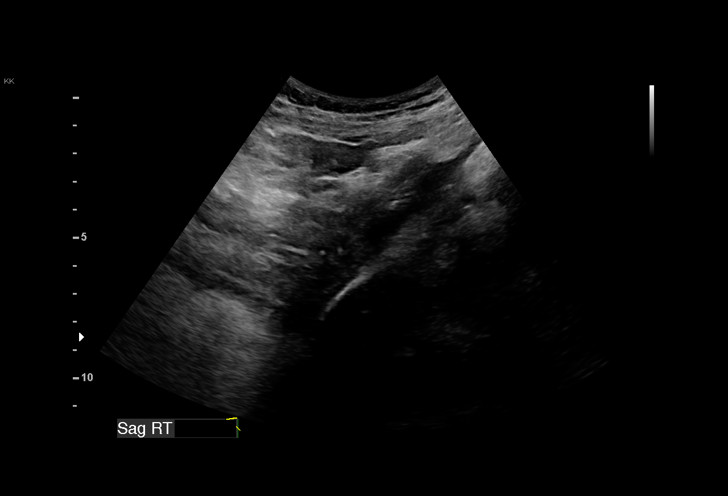
[im 16/19]
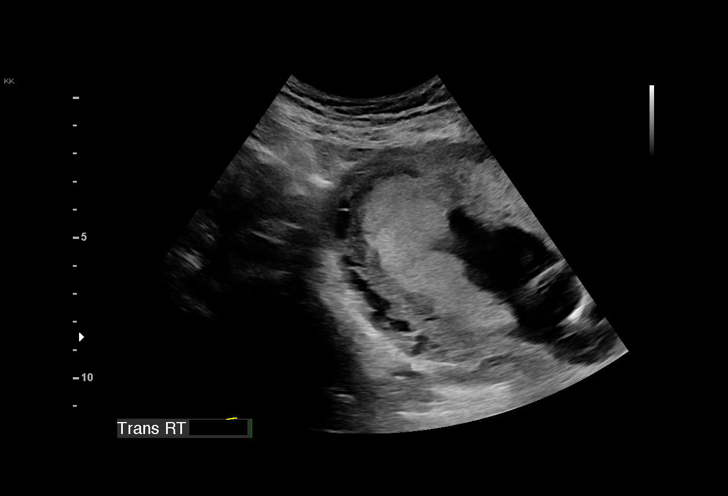
[im 18/19]
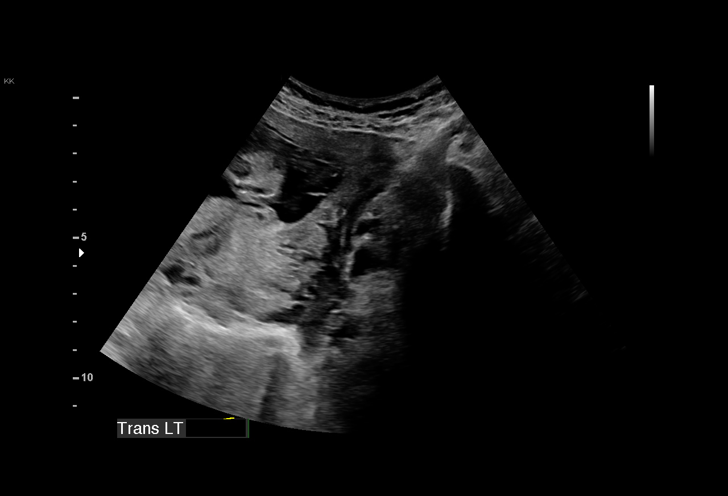
[im 19/19]
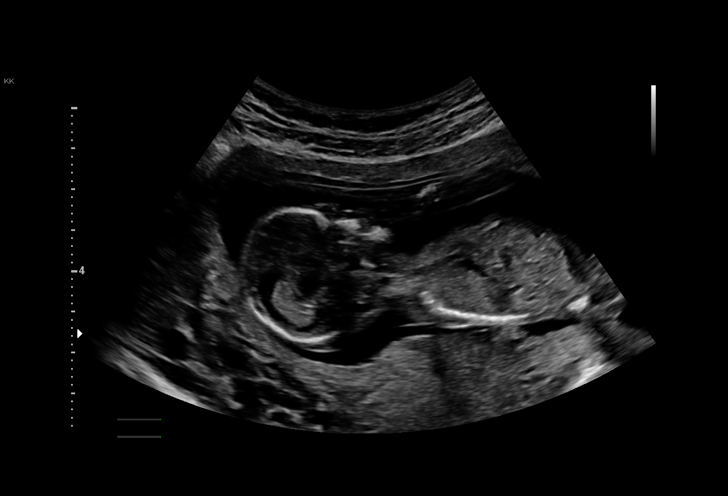

[15 of 19 positions shown; findings below may reference images not displayed]

pm)

[REDACTED]-
Faculty Physician

1  RUDI                [phone]      [phone]     [phone]
HEINRICH DANGER
Indications

15 weeks gestation of pregnancy
Encounter for uncertain dates
Encounter for antenatal screening for
malformations
Consanguinity
OB History

Blood Type:            Height:  5'2"   Weight (lb):  107      BMI:
Gravidity:    2         Term:   1        Prem:   0        SAB:   0
TOP:          0       Ectopic:  0        Living: 1
Fetal Evaluation

Num Of Fetuses:     1
Fetal Heart         150
Rate(bpm):
Cardiac Activity:   Observed
Presentation:       Variable

Amniotic Fluid
AFI FV:      Subjectively within normal limits
Biometry

BPD:      30.5  mm     G. Age:  15w 4d         73  %    CI:        82.26   %   70 - 86
FL/HC:      14.1   %   15.3 -
HC:      106.1  mm     G. Age:  15w 0d         33  %    HC/AC:      1.23       1.05 -
AC:       86.6  mm     G. Age:  15w 0d         52  %    FL/BPD:     49.2   %
FL:         15  mm     G. Age:  14w 3d         23  %    FL/AC:      17.3   %   20 - 24
Est. FW:     105  gm      0 lb 4 oz     73  %
Gestational Age

LMP:           13w 2d       Date:   07/28/16                 EDD:   05/04/17
U/S Today:     15w 0d                                        EDD:   04/22/17
Best:          15w 0d    Det. By:   U/S (10/29/16)           EDD:   04/22/17
Anatomy

Cranium:               Appears normal         Choroid Plexus:         Appears normal
Cervix Uterus Adnexa

Cervix
Normal appearance by transabdominal scan.

Adnexa:       No abnormality visualized.
Impression

SIUP at 15+0 weeks
No gross abnormalities identified
Markers of aneuploidy: none
Normal amniotic fluid volume
EDC based on today's measurements: 04/22/17

Ms. Harti was > 14 weeks so first trimester screening
could not be performed. After genetic counseling, she
declined all prenatal diagnosis testing.
Recommendations

Follow-up ultrasound in 3-4 weeks to complete anatomy
survey
